# Patient Record
Sex: Male | Born: 1965 | Race: White | Hispanic: No | Marital: Married | Smoking: Former smoker
Health system: Southern US, Community
[De-identification: ages and names within clinical notes are randomized; demographics above are authoritative.]

## PROBLEM LIST (undated history)

## (undated) DIAGNOSIS — M4802 Spinal stenosis, cervical region: Secondary | ICD-10-CM

## (undated) DIAGNOSIS — F329 Major depressive disorder, single episode, unspecified: Secondary | ICD-10-CM

## (undated) DIAGNOSIS — F32A Depression, unspecified: Secondary | ICD-10-CM

## (undated) HISTORY — PX: OTHER SURGICAL HISTORY: SHX169

## (undated) HISTORY — PX: CERVICAL SPINE SURGERY: SHX589

---

## 2005-01-12 ENCOUNTER — Emergency Department (HOSPITAL_COMMUNITY): Admission: EM | Admit: 2005-01-12 | Discharge: 2005-01-12 | Payer: Self-pay | Admitting: Family Medicine

## 2011-01-25 ENCOUNTER — Encounter: Payer: Self-pay | Admitting: Emergency Medicine

## 2011-01-25 ENCOUNTER — Emergency Department (HOSPITAL_COMMUNITY)
Admission: EM | Admit: 2011-01-25 | Discharge: 2011-01-25 | Disposition: A | Discharge status: Home / Self Care | Payer: BC Managed Care – PPO | Attending: Emergency Medicine | Admitting: Emergency Medicine

## 2011-01-25 DIAGNOSIS — R55 Syncope and collapse: Secondary | ICD-10-CM

## 2011-01-25 DIAGNOSIS — F3289 Other specified depressive episodes: Secondary | ICD-10-CM | POA: Insufficient documentation

## 2011-01-25 DIAGNOSIS — R404 Transient alteration of awareness: Secondary | ICD-10-CM | POA: Insufficient documentation

## 2011-01-25 DIAGNOSIS — M4802 Spinal stenosis, cervical region: Secondary | ICD-10-CM | POA: Insufficient documentation

## 2011-01-25 DIAGNOSIS — F329 Major depressive disorder, single episode, unspecified: Secondary | ICD-10-CM | POA: Insufficient documentation

## 2011-01-25 DIAGNOSIS — R110 Nausea: Secondary | ICD-10-CM | POA: Insufficient documentation

## 2011-01-25 DIAGNOSIS — R42 Dizziness and giddiness: Secondary | ICD-10-CM | POA: Insufficient documentation

## 2011-01-25 HISTORY — DX: Major depressive disorder, single episode, unspecified: F32.9

## 2011-01-25 HISTORY — DX: Depression, unspecified: F32.A

## 2011-01-25 HISTORY — DX: Spinal stenosis, cervical region: M48.02

## 2011-01-25 LAB — POCT I-STAT, CHEM 8
Hemoglobin: 13.9 g/dL (ref 13.0–17.0)
Sodium: 141 mEq/L (ref 135–145)
TCO2: 28 mmol/L (ref 0–100)

## 2011-01-25 LAB — CARDIAC PANEL(CRET KIN+CKTOT+MB+TROPI)
CK, MB: 3.3 ng/mL (ref 0.3–4.0)
Relative Index: 1.7 (ref 0.0–2.5)
Total CK: 196 U/L (ref 7–232)

## 2011-01-25 LAB — CBC
Hemoglobin: 13.7 g/dL (ref 13.0–17.0)
RBC: 4.36 MIL/uL (ref 4.22–5.81)

## 2011-01-25 MED ORDER — SODIUM CHLORIDE 0.9 % IV BOLUS (SEPSIS)
1000.0000 mL | Freq: Once | INTRAVENOUS | Status: AC
  Administered 2011-01-25: 1000 mL via INTRAVENOUS

## 2011-01-25 NOTE — ED Provider Notes (Signed)
History     CSN: [id number]  Arrival date & time 01/25/11  4098   First MD Initiated Contact with Patient 01/25/11 [phone]      Chief Complaint  Patient presents with  . Loss of Consciousness    (Consider location/radiation/quality/duration/timing/severity/associated sxs/prior treatment) Patient is a 46 y.o. male presenting with syncope. The history is provided by the patient.  Loss of Consciousness The current episode started 1 to 2 hours ago. The problem has been resolved. Pertinent negatives include no chest pain, no abdominal pain, no headaches and no shortness of breath. The symptoms are aggravated by standing.  Pt states he has had multiple episodes of syncope in the past associated with pain, nausea, and position change. His SBP runs in the 90's. He "jumped up from bed this morning due to a calf cramp to stretch it out. While doing so, he became lightheaded and nauseated. He sat down and lost consciousness briefly. Wife witnessed event. No significant head or neck trauma. Is feeling better now though he states he feels tired.  Past Medical History  Diagnosis Date  . Depression   . Cervical stenosis of spine     Past Surgical History  Procedure Date  . Cervical spine surgery     No family history on file.  History  Substance Use Topics  . Smoking status: Former Smoker -- 1.0 packs/day for 21 years  . Smokeless tobacco: Not on file  . Alcohol Use: 3.0 oz/week    5 Glasses of wine per week      Review of Systems  Constitutional: Positive for fatigue. Negative for fever, chills and diaphoresis.  HENT: Negative for neck pain.   Eyes: Negative.   Respiratory: Negative.  Negative for shortness of breath.   Cardiovascular: Positive for syncope. Negative for chest pain, palpitations and leg swelling.  Gastrointestinal: Positive for nausea. Negative for vomiting, abdominal pain, diarrhea and constipation.  Skin: Negative for rash and wound.  Neurological: Positive for  syncope and light-headedness. Negative for weakness, numbness and headaches.    Allergies  Review of patient's allergies indicates no known allergies.  Home Medications   Current Outpatient Rx  Name Route Sig Dispense Refill  . CITALOPRAM HYDROBROMIDE 20 MG PO TABS Oral Take 20 mg by mouth daily.        BP 105/74  Pulse 66  Temp(Src) 97.6 F (36.4 C) (Oral)  Resp 14  SpO2 98%  Physical Exam  Nursing note and vitals reviewed. Constitutional: He is oriented to person, place, and time. He appears well-developed and well-nourished. No distress.  HENT:  Head: Normocephalic and atraumatic.  Mouth/Throat: Oropharynx is clear and moist.  Eyes: EOM are normal. Pupils are equal, round, and reactive to light.  Neck: Normal range of motion. Neck supple.       No cervical midline TTP  Cardiovascular: Normal rate and regular rhythm.  Exam reveals no gallop and no friction rub.   No murmur heard. Pulmonary/Chest: Effort normal and breath sounds normal. No respiratory distress. He has no wheezes. He has no rales.  Abdominal: Soft. Bowel sounds are normal. He exhibits no mass. There is no tenderness. There is no rebound and no guarding.  Musculoskeletal: Normal range of motion. He exhibits no edema and no tenderness.  Neurological: He is alert and oriented to person, place, and time.       Finger to nose exam intact, 5/5 strength in all ext, sensation intact  Skin: Skin is warm and dry. No rash noted. No  erythema.  Psychiatric: He has a normal mood and affect. His behavior is normal.    ED Course  Procedures (including critical care time)  Labs Reviewed  POCT I-STAT, CHEM 8 - Abnormal; Notable for the following:    Glucose, Bld 101 (*)    All other components within normal limits  CBC  CARDIAC PANEL(CRET KIN+CKTOT+MB+TROPI)  I-STAT, CHEM 8   No results found.   1. Syncope      Date: 01/25/2011  Rate: 61  Rhythm: normal sinus rhythm  QRS Axis: normal  Intervals: normal   ST/T Wave abnormalities: normal  Conduction Disutrbances:none  Narrative Interpretation:   Old EKG Reviewed: unchanged    MDM  Will monitor pt closely and rule out insidious causes of pt syncope  Pt feeling well eating lunch. Have instructed to keep hydrated, rise slowly, f/u with pmd and return for worsening symptoms or concerns. Pt voices understanding      Loren Racer, MD 01/25/11 [phone]

## 2011-01-25 NOTE — ED Notes (Signed)
MD at bedside. 

## 2011-01-25 NOTE — ED Notes (Signed)
Per EMS pt woke up with cramp in leg, stood up quickly and next thing he knew he was on the floor with his wife yelling to him. Pt's wife stated he was unresponsive for a couple of minutes.Alert and oriented when EMS arrived,  pupils dilated, VSS, incomplete LBBB, pale.

## 2011-01-26 LAB — GLUCOSE, CAPILLARY: Glucose-Capillary: 95 mg/dL (ref 70–99)

## 2011-10-18 ENCOUNTER — Other Ambulatory Visit: Payer: Self-pay | Admitting: Sports Medicine

## 2011-10-19 ENCOUNTER — Other Ambulatory Visit: Payer: Self-pay | Admitting: *Deleted

## 2011-10-19 MED ORDER — CITALOPRAM HYDROBROMIDE 20 MG PO TABS: 20.0000 mg | ORAL_TABLET | Freq: Every day | ORAL | Status: DC

## 2011-10-19 NOTE — Progress Notes (Signed)
Refilled per Dr. Margaretha Sheffield.  Called pt and asked him to schedule an appt to establish care here.

## 2012-01-15 ENCOUNTER — Other Ambulatory Visit: Payer: Self-pay | Admitting: Sports Medicine

## 2012-01-19 ENCOUNTER — Other Ambulatory Visit: Payer: Self-pay | Admitting: *Deleted

## 2012-01-19 ENCOUNTER — Other Ambulatory Visit: Payer: Self-pay | Admitting: Sports Medicine

## 2012-01-19 MED ORDER — CITALOPRAM HYDROBROMIDE 20 MG PO TABS: 20.0000 mg | ORAL_TABLET | Freq: Every day | ORAL | Status: DC

## 2012-01-26 ENCOUNTER — Ambulatory Visit: Payer: BC Managed Care – PPO | Admitting: Sports Medicine

## 2018-03-05 ENCOUNTER — Other Ambulatory Visit: Payer: Self-pay | Admitting: Family Medicine

## 2018-03-05 DIAGNOSIS — M5126 Other intervertebral disc displacement, lumbar region: Secondary | ICD-10-CM

## 2018-03-05 DIAGNOSIS — M5442 Lumbago with sciatica, left side: Secondary | ICD-10-CM

## 2018-03-07 ENCOUNTER — Other Ambulatory Visit: Payer: Self-pay | Admitting: Family Medicine

## 2018-03-15 ENCOUNTER — Ambulatory Visit
Admission: RE | Admit: 2018-03-15 | Discharge: 2018-03-15 | Disposition: A | Discharge status: Home / Self Care | Payer: BC Managed Care – PPO | Source: Ambulatory Visit | Attending: Family Medicine | Admitting: Family Medicine

## 2018-03-15 DIAGNOSIS — M5442 Lumbago with sciatica, left side: Secondary | ICD-10-CM

## 2018-03-15 DIAGNOSIS — M5126 Other intervertebral disc displacement, lumbar region: Secondary | ICD-10-CM

## 2019-06-04 ENCOUNTER — Other Ambulatory Visit: Payer: Self-pay | Admitting: Sports Medicine

## 2019-06-05 ENCOUNTER — Ambulatory Visit
Admission: RE | Admit: 2019-06-05 | Discharge: 2019-06-05 | Disposition: A | Discharge status: Home / Self Care | Payer: BC Managed Care – PPO | Source: Ambulatory Visit | Attending: Sports Medicine | Admitting: Sports Medicine

## 2019-06-05 ENCOUNTER — Other Ambulatory Visit: Payer: Self-pay | Admitting: Sports Medicine

## 2019-06-05 DIAGNOSIS — M25531 Pain in right wrist: Secondary | ICD-10-CM

## 2021-03-27 ENCOUNTER — Emergency Department (HOSPITAL_BASED_OUTPATIENT_CLINIC_OR_DEPARTMENT_OTHER): Payer: BC Managed Care – PPO

## 2021-03-27 ENCOUNTER — Emergency Department (HOSPITAL_BASED_OUTPATIENT_CLINIC_OR_DEPARTMENT_OTHER)
Admission: EM | Admit: 2021-03-27 | Discharge: 2021-03-27 | Disposition: A | Discharge status: Home / Self Care | Payer: BC Managed Care – PPO | Attending: Emergency Medicine | Admitting: Emergency Medicine

## 2021-03-27 ENCOUNTER — Encounter (HOSPITAL_BASED_OUTPATIENT_CLINIC_OR_DEPARTMENT_OTHER): Payer: Self-pay | Admitting: Emergency Medicine

## 2021-03-27 ENCOUNTER — Other Ambulatory Visit: Payer: Self-pay

## 2021-03-27 DIAGNOSIS — S52044A Nondisplaced fracture of coronoid process of right ulna, initial encounter for closed fracture: Secondary | ICD-10-CM | POA: Insufficient documentation

## 2021-03-27 DIAGNOSIS — S52121A Displaced fracture of head of right radius, initial encounter for closed fracture: Secondary | ICD-10-CM | POA: Insufficient documentation

## 2021-03-27 NOTE — ED Notes (Signed)
ED Provider at bedside. 

## 2021-03-27 NOTE — ED Notes (Signed)
Patient transported to CT 

## 2021-03-27 NOTE — ED Notes (Signed)
This CNA will apply long arm splint after CT scan is performed ?

## 2021-03-27 NOTE — ED Notes (Signed)
EMT-P provided AVS using Teachback Method. Patient verbalizes understanding of Discharge Instructions. Opportunity for Questioning and Answers were provided by EMT-P. Patient Discharged from ED after splint application.  ? ?
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
## 2021-03-27 NOTE — Discharge Instructions (Signed)
Keep the splint in place till you see Dr. Steward Drone.  Elevate as tolerated and use the sling for comfort.  No lifting with the right arm.  Tylenol and ibuprofen as needed for pain. ?

## 2021-03-31 ENCOUNTER — Ambulatory Visit (HOSPITAL_BASED_OUTPATIENT_CLINIC_OR_DEPARTMENT_OTHER)
Admission: RE | Admit: 2021-03-31 | Discharge: 2021-03-31 | Disposition: A | Discharge status: Home / Self Care | Payer: BC Managed Care – PPO | Source: Ambulatory Visit | Attending: Orthopaedic Surgery | Admitting: Orthopaedic Surgery

## 2021-03-31 ENCOUNTER — Other Ambulatory Visit (HOSPITAL_BASED_OUTPATIENT_CLINIC_OR_DEPARTMENT_OTHER): Payer: Self-pay | Admitting: Orthopaedic Surgery

## 2021-03-31 ENCOUNTER — Ambulatory Visit (HOSPITAL_BASED_OUTPATIENT_CLINIC_OR_DEPARTMENT_OTHER): Payer: BC Managed Care – PPO | Admitting: Orthopaedic Surgery

## 2021-03-31 ENCOUNTER — Other Ambulatory Visit: Payer: Self-pay

## 2021-03-31 DIAGNOSIS — M25521 Pain in right elbow: Secondary | ICD-10-CM | POA: Insufficient documentation

## 2021-03-31 DIAGNOSIS — S52121A Displaced fracture of head of right radius, initial encounter for closed fracture: Secondary | ICD-10-CM
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
## 2021-04-06 ENCOUNTER — Other Ambulatory Visit (HOSPITAL_BASED_OUTPATIENT_CLINIC_OR_DEPARTMENT_OTHER): Payer: Self-pay | Admitting: Orthopaedic Surgery

## 2021-04-06 DIAGNOSIS — S52121A Displaced fracture of head of right radius, initial encounter for closed fracture: Secondary | ICD-10-CM

## 2021-04-07 ENCOUNTER — Ambulatory Visit (HOSPITAL_BASED_OUTPATIENT_CLINIC_OR_DEPARTMENT_OTHER): Payer: BC Managed Care – PPO | Admitting: Orthopaedic Surgery

## 2021-04-07 ENCOUNTER — Ambulatory Visit (HOSPITAL_BASED_OUTPATIENT_CLINIC_OR_DEPARTMENT_OTHER)
Admission: RE | Admit: 2021-04-07 | Discharge: 2021-04-07 | Disposition: A | Discharge status: Home / Self Care | Payer: BC Managed Care – PPO | Source: Ambulatory Visit | Attending: Orthopaedic Surgery | Admitting: Orthopaedic Surgery

## 2021-04-07 ENCOUNTER — Other Ambulatory Visit (HOSPITAL_BASED_OUTPATIENT_CLINIC_OR_DEPARTMENT_OTHER): Payer: Self-pay | Admitting: Orthopaedic Surgery

## 2021-04-07 ENCOUNTER — Other Ambulatory Visit: Payer: Self-pay

## 2021-04-07 DIAGNOSIS — S52121A Displaced fracture of head of right radius, initial encounter for closed fracture: Secondary | ICD-10-CM | POA: Insufficient documentation
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
## 2021-04-21 ENCOUNTER — Ambulatory Visit (HOSPITAL_BASED_OUTPATIENT_CLINIC_OR_DEPARTMENT_OTHER): Payer: BC Managed Care – PPO | Admitting: Orthopaedic Surgery

## 2021-04-21 DIAGNOSIS — S52121A Displaced fracture of head of right radius, initial encounter for closed fracture: Secondary | ICD-10-CM | POA: Diagnosis not present
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
## 2021-05-03 ENCOUNTER — Other Ambulatory Visit (HOSPITAL_BASED_OUTPATIENT_CLINIC_OR_DEPARTMENT_OTHER): Payer: Self-pay | Admitting: Orthopaedic Surgery

## 2021-05-03 ENCOUNTER — Ambulatory Visit (HOSPITAL_BASED_OUTPATIENT_CLINIC_OR_DEPARTMENT_OTHER)
Admission: RE | Admit: 2021-05-03 | Discharge: 2021-05-03 | Disposition: A | Discharge status: Home / Self Care | Payer: BC Managed Care – PPO | Source: Ambulatory Visit | Attending: Orthopaedic Surgery | Admitting: Orthopaedic Surgery

## 2021-05-03 ENCOUNTER — Ambulatory Visit (HOSPITAL_BASED_OUTPATIENT_CLINIC_OR_DEPARTMENT_OTHER): Payer: BC Managed Care – PPO | Admitting: Orthopaedic Surgery

## 2021-05-03 DIAGNOSIS — S52121A Displaced fracture of head of right radius, initial encounter for closed fracture: Secondary | ICD-10-CM | POA: Insufficient documentation
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
## 2021-05-05 ENCOUNTER — Ambulatory Visit (HOSPITAL_BASED_OUTPATIENT_CLINIC_OR_DEPARTMENT_OTHER): Payer: BC Managed Care – PPO | Admitting: Orthopaedic Surgery

## 2021-06-07 ENCOUNTER — Ambulatory Visit (HOSPITAL_BASED_OUTPATIENT_CLINIC_OR_DEPARTMENT_OTHER): Payer: BC Managed Care – PPO | Admitting: Orthopaedic Surgery

## 2021-06-09 ENCOUNTER — Other Ambulatory Visit (HOSPITAL_BASED_OUTPATIENT_CLINIC_OR_DEPARTMENT_OTHER): Payer: Self-pay | Admitting: Orthopaedic Surgery

## 2021-06-09 ENCOUNTER — Ambulatory Visit (HOSPITAL_BASED_OUTPATIENT_CLINIC_OR_DEPARTMENT_OTHER): Payer: BC Managed Care – PPO | Admitting: Orthopaedic Surgery

## 2021-06-09 ENCOUNTER — Ambulatory Visit (INDEPENDENT_AMBULATORY_CARE_PROVIDER_SITE_OTHER): Payer: BC Managed Care – PPO

## 2021-06-09 DIAGNOSIS — S52121A Displaced fracture of head of right radius, initial encounter for closed fracture: Secondary | ICD-10-CM | POA: Diagnosis not present
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
## 2021-06-18 ENCOUNTER — Telehealth: Payer: Self-pay | Admitting: Orthopaedic Surgery

## 2021-06-18 NOTE — Telephone Encounter (Signed)
RC to patient and informed him fax was sent 05/31, but I can leave the hard copy at our front desk for him to pick up

## 2021-06-18 NOTE — Telephone Encounter (Signed)
Pt called requesting a call back from [place]. Pt states his physical therapist sent a requesting for orders of a brace to help with range of motion. Please call pt about this matter at [phone]. Please have Dr. Steward Drone sign order and contact pt

## 2021-07-21 ENCOUNTER — Ambulatory Visit (HOSPITAL_BASED_OUTPATIENT_CLINIC_OR_DEPARTMENT_OTHER): Payer: BC Managed Care – PPO | Admitting: Orthopaedic Surgery

## 2021-07-21 DIAGNOSIS — S52121A Displaced fracture of head of right radius, initial encounter for closed fracture: Secondary | ICD-10-CM | POA: Diagnosis not present
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
## 2021-07-26 ENCOUNTER — Telehealth (HOSPITAL_BASED_OUTPATIENT_CLINIC_OR_DEPARTMENT_OTHER): Payer: Self-pay | Admitting: Orthopaedic Surgery

## 2021-07-26 NOTE — Telephone Encounter (Signed)
Pre-op

## 2021-07-28 ENCOUNTER — Ambulatory Visit (HOSPITAL_BASED_OUTPATIENT_CLINIC_OR_DEPARTMENT_OTHER): Payer: BC Managed Care – PPO | Admitting: Orthopaedic Surgery

## 2021-07-30 ENCOUNTER — Telehealth: Payer: Self-pay | Admitting: Orthopaedic Surgery

## 2021-07-30 NOTE — Telephone Encounter (Signed)
Patient called with a few questions about surgery, and after. Most questions were answered, but patient did have a question about limitations after surgery. Patient is planning a get away one week post surgery, and would like to know what to expect as far a lifting limitations with his arm. Please call to advise.

## 2021-07-30 NOTE — Telephone Encounter (Signed)
Holding for Dr Leanora Cover

## 2021-08-02 ENCOUNTER — Telehealth (HOSPITAL_BASED_OUTPATIENT_CLINIC_OR_DEPARTMENT_OTHER): Payer: Self-pay | Admitting: Orthopaedic Surgery

## 2021-08-02 NOTE — Telephone Encounter (Signed)
Request to resched surgery

## 2021-08-02 NOTE — Telephone Encounter (Signed)
I spoke with patient, and rescheduled his surgery until 08/31/21 for MCD.

## 2021-08-10 ENCOUNTER — Ambulatory Visit (INDEPENDENT_AMBULATORY_CARE_PROVIDER_SITE_OTHER): Payer: BC Managed Care – PPO | Admitting: Adult Health

## 2021-08-10 ENCOUNTER — Encounter: Payer: Self-pay | Admitting: Adult Health

## 2021-08-10 DIAGNOSIS — R0683 Snoring: Secondary | ICD-10-CM | POA: Diagnosis not present

## 2021-08-10 NOTE — Patient Instructions (Signed)
Set up for home sleep study  Work on healthy weight  Do not drive if sleepy  Follow up in 6 weeks to discuss results and treatment plan

## 2021-08-10 NOTE — Addendum Note (Signed)
Addended by: Delrae Rend on: 08/10/2021 09:52 AM   Modules accepted: Orders

## 2021-08-10 NOTE — Assessment & Plan Note (Signed)
Snoring , daytime sleepiness, and restless sleep all suspicious for underlying Sleep apnea.  - discussed how weight can impact sleep and risk for sleep disordered breathing - discussed options to assist with weight loss: combination of diet modification, cardiovascular and strength training exercises   - had an extensive discussion regarding the adverse health consequences related to untreated sleep disordered breathing - specifically discussed the risks for hypertension, coronary artery disease, cardiac dysrhythmias, cerebrovascular disease, and diabetes - lifestyle modification discussed   - discussed how sleep disruption can increase risk of accidents, particularly when driving - safe driving practices were discussed   Set up for home sleep study   Plan  Patient Instructions  Set up for home sleep study  Work on healthy weight  Do not drive if sleepy  Follow up in 6 weeks to discuss results and treatment plan
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
## 2021-08-19 NOTE — Progress Notes (Signed)
Reviewed and agree with assessment/plan.   Duaine Radin, MD [facility] 08/19/2021, 9:57 AM Pager:  [phone]  

## 2021-08-23 ENCOUNTER — Encounter (HOSPITAL_BASED_OUTPATIENT_CLINIC_OR_DEPARTMENT_OTHER): Payer: BC Managed Care – PPO | Admitting: Orthopaedic Surgery

## 2021-08-24 ENCOUNTER — Other Ambulatory Visit: Payer: Self-pay

## 2021-08-24 ENCOUNTER — Encounter (HOSPITAL_BASED_OUTPATIENT_CLINIC_OR_DEPARTMENT_OTHER): Payer: Self-pay | Admitting: Orthopaedic Surgery

## 2021-08-25 ENCOUNTER — Ambulatory Visit (HOSPITAL_BASED_OUTPATIENT_CLINIC_OR_DEPARTMENT_OTHER): Payer: Self-pay | Admitting: Orthopaedic Surgery

## 2021-08-25 DIAGNOSIS — S52121A Displaced fracture of head of right radius, initial encounter for closed fracture: Secondary | ICD-10-CM

## 2021-08-31 ENCOUNTER — Ambulatory Visit (HOSPITAL_BASED_OUTPATIENT_CLINIC_OR_DEPARTMENT_OTHER): Payer: BC Managed Care – PPO | Admitting: Anesthesiology

## 2021-08-31 ENCOUNTER — Encounter (HOSPITAL_BASED_OUTPATIENT_CLINIC_OR_DEPARTMENT_OTHER)
Admission: RE | Disposition: A | Discharge status: Home / Self Care | Payer: Self-pay | Source: Home / Self Care | Attending: Orthopaedic Surgery

## 2021-08-31 ENCOUNTER — Ambulatory Visit (HOSPITAL_BASED_OUTPATIENT_CLINIC_OR_DEPARTMENT_OTHER)
Admission: RE | Admit: 2021-08-31 | Discharge: 2021-08-31 | Disposition: A | Discharge status: Home / Self Care | Payer: BC Managed Care – PPO | Attending: Orthopaedic Surgery | Admitting: Orthopaedic Surgery

## 2021-08-31 ENCOUNTER — Ambulatory Visit (HOSPITAL_BASED_OUTPATIENT_CLINIC_OR_DEPARTMENT_OTHER): Payer: BC Managed Care – PPO

## 2021-08-31 ENCOUNTER — Other Ambulatory Visit: Payer: Self-pay

## 2021-08-31 ENCOUNTER — Encounter (HOSPITAL_BASED_OUTPATIENT_CLINIC_OR_DEPARTMENT_OTHER): Payer: Self-pay | Admitting: Orthopaedic Surgery

## 2021-08-31 DIAGNOSIS — M24021 Loose body in right elbow: Secondary | ICD-10-CM | POA: Insufficient documentation

## 2021-08-31 DIAGNOSIS — M4802 Spinal stenosis, cervical region: Secondary | ICD-10-CM

## 2021-08-31 DIAGNOSIS — S52121A Displaced fracture of head of right radius, initial encounter for closed fracture: Secondary | ICD-10-CM

## 2021-08-31 DIAGNOSIS — M24521 Contracture, right elbow: Secondary | ICD-10-CM

## 2021-08-31 DIAGNOSIS — M24621 Ankylosis, right elbow: Secondary | ICD-10-CM | POA: Insufficient documentation

## 2021-08-31 DIAGNOSIS — Z87891 Personal history of nicotine dependence: Secondary | ICD-10-CM | POA: Insufficient documentation

## 2021-08-31 HISTORY — PX: ELBOW ARTHROSCOPY: SHX614

## 2021-08-31 SURGERY — ARTHROSCOPY, ELBOW
Anesthesia: General | Site: Elbow | Laterality: Right

## 2021-08-31 MED ORDER — FENTANYL CITRATE (PF) 100 MCG/2ML IJ SOLN
100.0000 ug | Freq: Once | INTRAMUSCULAR | Status: AC
  Administered 2021-08-31: 50 ug via INTRAVENOUS

## 2021-08-31 MED ORDER — SODIUM CHLORIDE 0.9 % IR SOLN
Status: DC | PRN
  Administered 2021-08-31: 3500 mL

## 2021-08-31 MED ORDER — FENTANYL CITRATE (PF) 100 MCG/2ML IJ SOLN: 25.0000 ug | INTRAMUSCULAR | Status: DC | PRN

## 2021-08-31 MED ORDER — FENTANYL CITRATE (PF) 100 MCG/2ML IJ SOLN
INTRAMUSCULAR | Status: AC
  Filled 2021-08-31: qty 2

## 2021-08-31 MED ORDER — DIPHENHYDRAMINE HCL 50 MG/ML IJ SOLN
INTRAMUSCULAR | Status: DC | PRN
  Administered 2021-08-31: 12.5 mg via INTRAVENOUS

## 2021-08-31 MED ORDER — ACETAMINOPHEN 500 MG PO TABS
ORAL_TABLET | ORAL | Status: AC
  Filled 2021-08-31: qty 2

## 2021-08-31 MED ORDER — ONDANSETRON HCL 4 MG/2ML IJ SOLN
INTRAMUSCULAR | Status: DC | PRN
  Administered 2021-08-31: 4 mg via INTRAVENOUS

## 2021-08-31 MED ORDER — MIDAZOLAM HCL 2 MG/2ML IJ SOLN
2.0000 mg | Freq: Once | INTRAMUSCULAR | Status: AC
  Administered 2021-08-31: 1 mg via INTRAVENOUS

## 2021-08-31 MED ORDER — CEFAZOLIN SODIUM-DEXTROSE 2-4 GM/100ML-% IV SOLN
INTRAVENOUS | Status: AC
  Filled 2021-08-31: qty 100

## 2021-08-31 MED ORDER — OXYCODONE HCL 5 MG PO TABS: 5.0000 mg | ORAL_TABLET | ORAL | 0 refills | Status: DC | PRN

## 2021-08-31 MED ORDER — MIDAZOLAM HCL 5 MG/5ML IJ SOLN
INTRAMUSCULAR | Status: DC | PRN
  Administered 2021-08-31: 1 mg via INTRAVENOUS

## 2021-08-31 MED ORDER — LACTATED RINGERS IV SOLN: INTRAVENOUS | Status: DC

## 2021-08-31 MED ORDER — MIDAZOLAM HCL 2 MG/2ML IJ SOLN
INTRAMUSCULAR | Status: AC
  Filled 2021-08-31: qty 2

## 2021-08-31 MED ORDER — LIDOCAINE 2% (20 MG/ML) 5 ML SYRINGE
INTRAMUSCULAR | Status: AC
  Filled 2021-08-31: qty 5

## 2021-08-31 MED ORDER — TRANEXAMIC ACID-NACL 1000-0.7 MG/100ML-% IV SOLN
INTRAVENOUS | Status: AC
  Filled 2021-08-31: qty 100

## 2021-08-31 MED ORDER — OXYCODONE HCL 5 MG/5ML PO SOLN: 5.0000 mg | Freq: Once | ORAL | Status: DC | PRN

## 2021-08-31 MED ORDER — ONDANSETRON HCL 4 MG/2ML IJ SOLN: 4.0000 mg | Freq: Once | INTRAMUSCULAR | Status: DC | PRN

## 2021-08-31 MED ORDER — LIDOCAINE HCL 1 % IJ SOLN
INTRAMUSCULAR | Status: DC | PRN
  Administered 2021-08-31: 50 mg via INTRADERMAL

## 2021-08-31 MED ORDER — ONDANSETRON HCL 4 MG/2ML IJ SOLN
INTRAMUSCULAR | Status: AC
  Filled 2021-08-31: qty 2

## 2021-08-31 MED ORDER — PROPOFOL 500 MG/50ML IV EMUL
INTRAVENOUS | Status: AC
  Filled 2021-08-31: qty 50

## 2021-08-31 MED ORDER — ACETAMINOPHEN 500 MG PO TABS: 500.0000 mg | ORAL_TABLET | Freq: Three times a day (TID) | ORAL | 0 refills | Status: AC

## 2021-08-31 MED ORDER — DEXAMETHASONE SODIUM PHOSPHATE 10 MG/ML IJ SOLN
INTRAMUSCULAR | Status: AC
  Filled 2021-08-31: qty 1

## 2021-08-31 MED ORDER — GABAPENTIN 300 MG PO CAPS
ORAL_CAPSULE | ORAL | Status: AC
  Filled 2021-08-31: qty 1

## 2021-08-31 MED ORDER — TRANEXAMIC ACID-NACL 1000-0.7 MG/100ML-% IV SOLN
1000.0000 mg | INTRAVENOUS | Status: AC
  Administered 2021-08-31: 1000 mg via INTRAVENOUS

## 2021-08-31 MED ORDER — FENTANYL CITRATE (PF) 100 MCG/2ML IJ SOLN
INTRAMUSCULAR | Status: DC | PRN
  Administered 2021-08-31: 50 ug via INTRAVENOUS

## 2021-08-31 MED ORDER — PROPOFOL 500 MG/50ML IV EMUL
INTRAVENOUS | Status: DC | PRN
  Administered 2021-08-31: 110 ug/kg/min via INTRAVENOUS

## 2021-08-31 MED ORDER — IBUPROFEN 800 MG PO TABS: 800.0000 mg | ORAL_TABLET | Freq: Three times a day (TID) | ORAL | 0 refills | Status: AC

## 2021-08-31 MED ORDER — GABAPENTIN 300 MG PO CAPS
300.0000 mg | ORAL_CAPSULE | Freq: Once | ORAL | Status: AC
  Administered 2021-08-31: 300 mg via ORAL

## 2021-08-31 MED ORDER — DEXAMETHASONE SODIUM PHOSPHATE 10 MG/ML IJ SOLN
INTRAMUSCULAR | Status: DC | PRN
  Administered 2021-08-31: 8 mg via INTRAVENOUS

## 2021-08-31 MED ORDER — OXYCODONE HCL 5 MG PO TABS: 5.0000 mg | ORAL_TABLET | Freq: Once | ORAL | Status: DC | PRN

## 2021-08-31 MED ORDER — ROPIVACAINE HCL 5 MG/ML IJ SOLN
INTRAMUSCULAR | Status: DC | PRN
  Administered 2021-08-31: 30 mL via PERINEURAL

## 2021-08-31 MED ORDER — ACETAMINOPHEN 500 MG PO TABS
1000.0000 mg | ORAL_TABLET | Freq: Once | ORAL | Status: AC
  Administered 2021-08-31: 1000 mg via ORAL

## 2021-08-31 MED ORDER — CEFAZOLIN SODIUM-DEXTROSE 2-4 GM/100ML-% IV SOLN
2.0000 g | INTRAVENOUS | Status: AC
  Administered 2021-08-31: 2 g via INTRAVENOUS

## 2021-08-31 MED ORDER — PROPOFOL 10 MG/ML IV BOLUS
INTRAVENOUS | Status: DC | PRN
  Administered 2021-08-31: 170 mg via INTRAVENOUS

## 2021-08-31 SURGICAL SUPPLY — 51 items
APL PRP STRL LF DISP 70% ISPRP (MISCELLANEOUS) ×1
BLADE EXCALIBUR 4.0X13 (MISCELLANEOUS) IMPLANT
BLADE SURG 15 STRL LF DISP TIS (BLADE) IMPLANT
BLADE SURG 15 STRL SS (BLADE)
BNDG CMPR 9X4 STRL LF SNTH (GAUZE/BANDAGES/DRESSINGS)
BNDG ELASTIC 4X5.8 VLCR STR LF (GAUZE/BANDAGES/DRESSINGS) ×2 IMPLANT
BNDG ELASTIC 6X5.8 VLCR STR LF (GAUZE/BANDAGES/DRESSINGS) ×2 IMPLANT
BNDG ESMARK 4X9 LF (GAUZE/BANDAGES/DRESSINGS) IMPLANT
CHLORAPREP W/TINT 26 (MISCELLANEOUS) ×2 IMPLANT
CUFF TOURN SGL QUICK 18X4 (TOURNIQUET CUFF) IMPLANT
DISSECTOR  3.8MM X 13CM (MISCELLANEOUS) ×2
DISSECTOR 3.8MM X 13CM (MISCELLANEOUS) ×1 IMPLANT
DRAPE ARTHROSCOPY W/POUCH 90 (DRAPES) ×2 IMPLANT
DRAPE C-ARM 42X72 X-RAY (DRAPES) ×1 IMPLANT
DRAPE IMP U-DRAPE 54X76 (DRAPES) ×2 IMPLANT
DRAPE INCISE IOBAN 66X45 STRL (DRAPES) IMPLANT
DRAPE U-SHAPE 47X51 STRL (DRAPES) ×2 IMPLANT
DRSG PAD ABDOMINAL 8X10 ST (GAUZE/BANDAGES/DRESSINGS) ×2 IMPLANT
DW OUTFLOW CASSETTE/TUBE SET (MISCELLANEOUS) IMPLANT
ELECT REM PT RETURN 9FT ADLT (ELECTROSURGICAL)
ELECTRODE REM PT RTRN 9FT ADLT (ELECTROSURGICAL) IMPLANT
EXCALIBUR 3.8MM X 13CM (MISCELLANEOUS) ×1 IMPLANT
GAUZE 4X4 16PLY ~~LOC~~+RFID DBL (SPONGE) IMPLANT
GAUZE SPONGE 4X4 12PLY STRL (GAUZE/BANDAGES/DRESSINGS) ×2 IMPLANT
GAUZE XEROFORM 1X8 LF (GAUZE/BANDAGES/DRESSINGS) IMPLANT
GLOVE BIO SURGEON STRL SZ 6 (GLOVE) ×2 IMPLANT
GLOVE BIO SURGEON STRL SZ7.5 (GLOVE) ×2 IMPLANT
GLOVE BIOGEL PI IND STRL 6.5 (GLOVE) ×1 IMPLANT
GLOVE BIOGEL PI IND STRL 8 (GLOVE) ×1 IMPLANT
GLOVE BIOGEL PI INDICATOR 6.5 (GLOVE) ×1
GLOVE BIOGEL PI INDICATOR 8 (GLOVE) ×1
GOWN STRL REUS W/ TWL LRG LVL3 (GOWN DISPOSABLE) ×1 IMPLANT
GOWN STRL REUS W/ TWL XL LVL3 (GOWN DISPOSABLE) ×1 IMPLANT
GOWN STRL REUS W/TWL LRG LVL3 (GOWN DISPOSABLE) ×2
GOWN STRL REUS W/TWL XL LVL3 (GOWN DISPOSABLE) ×2
MANIFOLD NEPTUNE II (INSTRUMENTS) ×2 IMPLANT
NDL HYPO 18GX1.5 BLUNT FILL (NEEDLE) ×1 IMPLANT
NEEDLE HYPO 18GX1.5 BLUNT FILL (NEEDLE) ×2 IMPLANT
PACK ARTHROSCOPY DSU (CUSTOM PROCEDURE TRAY) ×2 IMPLANT
PACK BASIN DAY SURGERY FS (CUSTOM PROCEDURE TRAY) ×2 IMPLANT
PADDING CAST COTTON 6X4 STRL (CAST SUPPLIES) ×2 IMPLANT
PENCIL SMOKE EVACUATOR (MISCELLANEOUS) IMPLANT
PORT APPOLLO RF 90DEGREE MULTI (SURGICAL WAND) IMPLANT
SHAVER DISSECTOR 3.0 (BURR) ×1 IMPLANT
SLEEVE SCD COMPRESS KNEE MED (STOCKING) ×2 IMPLANT
SLING ARM FOAM STRAP LRG (SOFTGOODS) ×1 IMPLANT
SUT ETHILON 3 0 PS 1 (SUTURE) ×2 IMPLANT
SUT VIC AB 3-0 FS2 27 (SUTURE) IMPLANT
SYR 5ML LL (SYRINGE) ×2 IMPLANT
TOWEL GREEN STERILE FF (TOWEL DISPOSABLE) ×2 IMPLANT
TUBING ARTHROSCOPY IRRIG 16FT (MISCELLANEOUS) ×2 IMPLANT

## 2021-08-31 NOTE — Transfer of Care (Signed)
Immediate Anesthesia Transfer of Care Note  Patient: Mordecai Tindol  Procedure(s) Performed: RIGHT ARTHROSCOPY ELBOW WITH REMOVAL OF LOOSE BODY (Right: Elbow)  Patient Location: PACU  Anesthesia Type:General  Level of Consciousness: awake, alert , oriented and patient cooperative  Airway & Oxygen Therapy: Patient Spontanous Breathing and Patient connected to face mask oxygen  Post-op Assessment: Report given to RN and Post -op Vital signs reviewed and stable  Post vital signs: Reviewed and stable  Last Vitals:  Vitals Value Taken Time  BP 125/85 08/31/21 1239  Temp 36.5 C 08/31/21 1237  Pulse 64 08/31/21 1243  Resp 13 08/31/21 1243  SpO2 98 % 08/31/21 1243  Vitals shown include unvalidated device data.  Last Pain:  Vitals:   08/31/21 1239  TempSrc:   PainSc: 0-No pain      Patients Stated Pain Goal: 5 (08/31/21 0820)  Complications: No notable events documented.

## 2021-08-31 NOTE — Anesthesia Preprocedure Evaluation (Addendum)
Anesthesia Evaluation  Patient identified by MRN, date of birth, ID band Patient awake    Reviewed: Allergy & Precautions, NPO status , Patient's Chart, lab work & pertinent test results  Airway Mallampati: II  TM Distance: >3 FB Neck ROM: Full    Dental no notable dental hx. (+) Teeth Intact, Dental Advisory Given   Pulmonary former smoker,    Pulmonary exam normal breath sounds clear to auscultation       Cardiovascular negative cardio ROS Normal cardiovascular exam Rhythm:Regular Rate:Normal     Neuro/Psych PSYCHIATRIC DISORDERS Depression negative neurological ROS     GI/Hepatic negative GI ROS, Neg liver ROS,   Endo/Other  negative endocrine ROS  Renal/GU negative Renal ROS  negative genitourinary   Musculoskeletal Loose Body right elbow   Abdominal   Peds  Hematology negative hematology ROS (+)   Anesthesia Other Findings   Reproductive/Obstetrics negative OB ROS                             Anesthesia Physical Anesthesia Plan  ASA: 2  Anesthesia Plan: General   Post-op Pain Management: Regional block* and Minimal or no pain anticipated   Induction: Intravenous  PONV Risk Score and Plan: 1 and Treatment may vary due to age or medical condition and Propofol infusion  Airway Management Planned: LMA  Additional Equipment: None  Intra-op Plan:   Post-operative Plan: Extubation in OR  Informed Consent: I have reviewed the patients History and Physical, chart, labs and discussed the procedure including the risks, benefits and alternatives for the proposed anesthesia with the patient or authorized representative who has indicated his/her understanding and acceptance.     Dental advisory given  Plan Discussed with: CRNA and Anesthesiologist  Anesthesia Plan Comments:        Anesthesia Quick Evaluation
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
## 2021-08-31 NOTE — Brief Op Note (Signed)
   Brief Op Note  Date of Surgery: 08/31/2021  Preoperative Diagnosis: RIGHT ELBOW LOOSE BODY  Postoperative Diagnosis: same  Procedure: Procedure(s): RIGHT ARTHROSCOPY ELBOW WITH REMOVAL OF LOOSE BODY  Implants: * No implants in log *  Surgeons: Surgeon(s): [name], MD  Anesthesia: General    Estimated Blood Loss: See anesthesia record  Complications: None  Condition to PACU: Stable  Benancio Deeds, MD 08/31/2021 12:30 PM

## 2021-08-31 NOTE — Discharge Instructions (Addendum)
Discharge Instructions    Attending Surgeon: [name], MD Office Phone Number: [phone]   Diagnosis and Procedures:    Surgeries Performed: Right elbow debridement   Discharge Plan:    Diet: Resume usual diet. Begin with light or bland foods.  Drink plenty of fluids.  Activity:  You may use your arm as tolerated once her block is worn You are advised to go home directly from the hospital or surgical center. Restrict your activities.  GENERAL INSTRUCTIONS: 1.  Keep your surgical site elevated above your heart for at least 5-7 days or longer to prevent swelling. This will improve your comfort and your overall recovery following surgery.     2. Please call Dr. Serena Croissant office at [phone] with questions Monday-Friday during business hours. If no one answers, please leave a message and someone should get back to the patient within 24 hours. For emergencies please call 911 or proceed to the emergency room.   3. Patient to notify surgical team if experiences any of the following: Bowel/Bladder dysfunction, uncontrolled pain, nerve/muscle weakness, incision with increased drainage or redness, nausea/vomiting and Fever greater than 101.0 F.  Be alert for signs of infection including redness, streaking, odor, fever or chills. Be alert for excessive pain or bleeding and notify your surgeon immediately.  WOUND INSTRUCTIONS:   Leave your dressing/cast/splint in place until your post operative visit.  Keep it clean and dry.  Always keep the incision clean and dry until the staples/sutures are removed. If there is no drainage from the incision you should keep it open to air. If there is drainage from the incision you must keep it covered at all times until the drainage stops  Do not soak in a bath tub, hot tub, pool, lake or other body of water until 21 days after your surgery and your incision is completely dry and healed.  If you have removable sutures (or staples) they  must be removed 10-14 days (unless otherwise instructed) from the day of your surgery.     1)  Elevate the extremity as much as possible.  2)  Keep the dressing clean and dry.  3)  Please call us if the dressing becomes wet or dirty.  4)  If you are experiencing worsening pain or worsening swelling, please call.     MEDICATIONS: Resume all previous home medications at the previous prescribed dose and frequency unless otherwise noted Start taking the  pain medications on an as-needed basis as prescribed  Please taper down pain medication over the next week following surgery.  Ideally you should not require a refill of any narcotic pain medication.  Take pain medication with food to minimize nausea. In addition to the prescribed pain medication, you may take over-the-counter pain relievers such as Tylenol.  Do NOT take additional tylenol if your pain medication already has tylenol in it.  Aspirin 325mg  daily for four weeks.      FOLLOWUP INSTRUCTIONS: 1. Follow up at the Physical Therapy Clinic 3-4 days following surgery. This appointment should be scheduled unless other arrangements have been made.The Physical Therapy scheduling number is [phone] if an appointment has not already been arranged.  2. Contact Dr. [phone] office during office hours at [phone] or the practice after hours line at [phone] for non-emergencies. For medical emergencies call 911.   Discharge Location: Home   No Tylenol until after 2:30pm today   Post Anesthesia Home Care Instructions  Activity: Get plenty of rest for the  remainder of the day. A responsible individual must stay with you for 24 hours following the procedure.  For the next 24 hours, DO NOT: -Drive a car -Advertising copywriter -Drink alcoholic beverages -Take any medication unless instructed by your physician -Make any legal decisions or sign important papers.  Meals: Start with liquid foods such as gelatin or soup. Progress  to regular foods as tolerated. Avoid greasy, spicy, heavy foods. If nausea and/or vomiting occur, drink only clear liquids until the nausea and/or vomiting subsides. Call your physician if vomiting continues.  Special Instructions/Symptoms: Your throat may feel dry or sore from the anesthesia or the breathing tube placed in your throat during surgery. If this causes discomfort, gargle with warm salt water. The discomfort should disappear within 24 hours.  If you had a scopolamine patch placed behind your ear for the management of post- operative nausea and/or vomiting:  1. The medication in the patch is effective for 72 hours, after which it should be removed.  Wrap patch in a tissue and discard in the trash. Wash hands thoroughly with soap and water. 2. You may remove the patch earlier than 72 hours if you experience unpleasant side effects which may include dry mouth, dizziness or visual disturbances. 3. Avoid touching the patch. Wash your hands with soap and water after contact with the patch.  Regional Anesthesia Blocks  1. Numbness or the inability to move the "blocked" extremity may last from 3-48 hours after placement. The length of time depends on the medication injected and your individual response to the medication. If the numbness is not going away after 48 hours, call your surgeon.  2. The extremity that is blocked will need to be protected until the numbness is gone and the  Strength has returned. Because you cannot feel it, you will need to take extra care to avoid injury. Because it may be weak, you may have difficulty moving it or using it. You may not know what position it is in without looking at it while the block is in effect.  3. For blocks in the legs and feet, returning to weight bearing and walking needs to be done carefully. You will need to wait until the numbness is entirely gone and the strength has returned. You should be able to move your leg and foot normally before  you try and bear weight or walk. You will need someone to be with you when you first try to ensure you do not fall and possibly risk injury.  4. Bruising and tenderness at the needle site are common side effects and will resolve in a few days.  5. Persistent numbness or new problems with movement should be communicated to the surgeon or the [facility] [phone] [facility] [phone]).

## 2021-08-31 NOTE — Anesthesia Procedure Notes (Addendum)
Anesthesia Regional Block: Supraclavicular block   Pre-Anesthetic Checklist: , timeout performed,  Correct Patient, Correct Site, Correct Laterality,  Correct Procedure, Correct Position, site marked,  Risks and benefits discussed,  Surgical consent,  Pre-op evaluation,  At surgeon's request and post-op pain management  Laterality: Right  Prep: chloraprep       Needles:  Injection technique: Single-shot  Needle Type: Echogenic Stimulator Needle     Needle Length: 10cm  Needle Gauge: 21   Needle insertion depth: 7 cm   Additional Needles:   Procedures:,,,, ultrasound used (permanent image in chart),,   Motor weakness within 5 minutes.  Narrative:  Start time: 08/31/2021 9:37 AM End time: 08/31/2021 9:42 AM Injection made incrementally with aspirations every 5 mL.  Performed by: Personally  Anesthesiologist: Mal Amabile, MD  Additional Notes: Timeout performed. Patient sedated. Relevant anatomy ID'd using [place]. Incremental 2-38ml injection of LA with frequent aspiration. Patient tolerated procedure well.     Right Supraclavicular Block

## 2021-08-31 NOTE — Anesthesia Postprocedure Evaluation (Signed)
Anesthesia Post Note  Patient: Ivan Warner  Procedure(s) Performed: RIGHT ARTHROSCOPY ELBOW WITH REMOVAL OF LOOSE BODY (Right: Elbow)     Patient location during evaluation: PACU Anesthesia Type: Regional and General Level of consciousness: awake and alert and oriented Pain management: pain level controlled Vital Signs Assessment: post-procedure vital signs reviewed and stable Respiratory status: spontaneous breathing, nonlabored ventilation and respiratory function stable Cardiovascular status: blood pressure returned to baseline and stable Postop Assessment: no apparent nausea or vomiting Anesthetic complications: no   No notable events documented.  Last Vitals:  Vitals:   08/31/21 1300 08/31/21 1310  BP: (!) 128/92 (!) 139/98  Pulse: 69 74  Resp: 11 14  Temp:  (!) 36.2 C  SpO2: 96% 94%    Last Pain:  Vitals:   08/31/21 1300  TempSrc:   PainSc: 0-No pain                 Bailey Kolbe A.

## 2021-08-31 NOTE — Interval H&P Note (Signed)
History and Physical Interval Note:  08/31/2021 10:02 AM  Ivan Warner  has presented today for surgery, with the diagnosis of RIGHT ELBOW LOOSE BODY.  The various methods of treatment have been discussed with the patient and family. After consideration of risks, benefits and other options for treatment, the patient has consented to  Procedure(s): RIGHT ARTHROSCOPY ELBOW WITH REMOVAL OF LOOSE BODY (Right) as a surgical intervention.  The patient's history has been reviewed, patient examined, no change in status, stable for surgery.  I have reviewed the patient's chart and labs.  Questions were answered to the patient's satisfaction.     [name]

## 2021-08-31 NOTE — Progress Notes (Signed)
Assisted Dr. Foster with right, supraclavicular, ultrasound guided block. Side rails up, monitors on throughout procedure. See vital signs in flow sheet. Tolerated Procedure well. 

## 2021-08-31 NOTE — Op Note (Signed)
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  His coronoid and radial head fractures have healed but he has been left with a terminal loss of extension which has been bothering him..  The risk and benefits of the procedure were discussed in detail and documented in the pre-operative evaluation.   PREOPERATIVE DIAGNOSIS: 1.  Right elbow arthrofibrosis  POSTOPERATIVE DIAGNOSIS: Same.  PROCEDURE: 1.  Right elbow arthroscopy with anterior capsular release and removal of loose body  SURGEON: Benancio Deeds MD  ASSISTANT: Kerby Less, ATC  ANESTHESIA:  general plus interscalene nerve block  IV FLUIDS AND URINE: See anesthesia record.  ANTIBIOTICS: Ancef  ESTIMATED BLOOD LOSS: 10 mL.  IMPLANTS:  * No implants in log *  DRAINS: None  CULTURES: None  COMPLICATIONS: none  DESCRIPTION OF PROCEDURE:  Right elbow arthroscopy exam under anesthesia: Chondral surfaces are well-appearing Significant scarring in the anterior as well as posterior olecranon fossa Small loose body in the posterior radiocapitellar joint Range of motion was from 130 degrees of flexion to 15 degrees of extension with 70 degrees of pro and supination   Patient was identified in preoperative holding area.  The correct site was marked according to universal protocol.  Interscalene nerve block was performed.  He was subsequently taken back to the operating room.  Anesthesia was induced.  Appropriate antibiotics were given 1 hour prior to skin incision.  He was prepped and draped in usual sterile fashion in the lateral decubitus position with all bony prominences padded.  Final timeout was again performed.  I began with a diagnostic arthroscopy of the right elbow.  25 cc of normal saline was used to insufflate the joint.  An anterior medial incision was made just through skin and snap was used to  spread down to capsule.  At this time the joint was visualized and there was significant scar tissue involving the anterior humeral fossa.  Spinal needle was used to create the anterior lateral portal 2 cm proximal to the lateral epicondyle and an inside-out fashion.  Again 15 blade was used to incise just through skin.  Staff was used to spread down to the level of capsule and the shaver was introduced.  Collene Mares was used to debride the intra-articular scar tissue.  At this time the anterior capsule was identified.  A straight biter was used to carefully initially release muscle from the anterior capsule and then to debride and release the anterior capsule.  The camera was switched into the anterior lateral portal using a switching stick and the same procedure was utilized.  Care was taken to minimize suction of the shaver so as to minimize contraction of the joint.  Following this a posterior lateral portal was made in the soft spot.  The scope was introduced.  The radial head was visualized and his fracture was found to be healed.  A separate posterior lateral incision was made between the olecranon and radial head in order to access the space with a shaver.  A shaver was introduced and there was a small loose body that was then shaved free in this area.  Following this a manipulation was performed with the elbow near full extension following the anterior capsule release.  The fluid was evacuated and the wounds were closed with 3-0 nylon.  A soft dressing was applied with gauze Webril and Ace.  A sling was applied.  All counts were correct at the end of the case.  There were no complications.    POSTOPERATIVE PLAN: He will be weightbearing as tolerated on the right arm once his block wears off.  He will begin early aggressive range of motion with physical therapy.  I will plan to see him back in 2 weeks for suture removal  Benancio Deeds, MD 12:40 PM

## 2021-08-31 NOTE — Anesthesia Procedure Notes (Signed)
Procedure Name: LMA Insertion Date/Time: 08/31/2021 10:59 AM  Performed by: Garth Bigness, CRNAPre-anesthesia Checklist: Patient identified, Emergency Drugs available, Suction available and Patient being monitored Patient Re-evaluated:Patient Re-evaluated prior to induction Oxygen Delivery Method: Circle system utilized Preoxygenation: Pre-oxygenation with 100% oxygen Induction Type: IV induction Ventilation: Mask ventilation without difficulty LMA: LMA inserted LMA Size: 4.0 Tube type: Oral Number of attempts: 1 Placement Confirmation: ETT inserted through vocal cords under direct vision, positive ETCO2 and breath sounds checked- equal and bilateral Tube secured with: Tape Dental Injury: Teeth and Oropharynx as per pre-operative assessment

## 2021-09-01 ENCOUNTER — Encounter (HOSPITAL_BASED_OUTPATIENT_CLINIC_OR_DEPARTMENT_OTHER): Payer: Self-pay | Admitting: Orthopaedic Surgery

## 2021-09-01 NOTE — Addendum Note (Signed)
Addendum  created 09/01/21 1354 by Burna Cash, CRNA   Charge Capture section accepted

## 2021-09-06 ENCOUNTER — Encounter (HOSPITAL_BASED_OUTPATIENT_CLINIC_OR_DEPARTMENT_OTHER): Payer: BC Managed Care – PPO | Admitting: Orthopaedic Surgery

## 2021-09-13 ENCOUNTER — Ambulatory Visit (INDEPENDENT_AMBULATORY_CARE_PROVIDER_SITE_OTHER): Payer: BC Managed Care – PPO | Admitting: Orthopaedic Surgery

## 2021-09-13 DIAGNOSIS — S52121A Displaced fracture of head of right radius, initial encounter for closed fracture: Secondary | ICD-10-CM

## 2021-09-13 NOTE — Progress Notes (Signed)
Post Operative Evaluation    Procedure/Date of Surgery: Right elbow arthroscopy with 08/31/21  Interval History:   Presents today status post right elbow arthroscopy with anterior capsular release.  Overall he is continue to work aggressively with physical therapy.  He has regained some of his motion and is about to 3 degrees at this point.  He is getting fewer mechanical symptoms about the right elbow at this point which she is much happier with.   PMH/PSH/Family History/Social History/Meds/Allergies:    Past Medical History:  Diagnosis Date   Cervical stenosis of spine    Depression    Past Surgical History:  Procedure Laterality Date   arm surgery Right    CERVICAL SPINE SURGERY     ELBOW ARTHROSCOPY Right 08/31/2021   Procedure: RIGHT ARTHROSCOPY ELBOW WITH REMOVAL OF LOOSE BODY;  Surgeon: [name], MD;  Location: [facility];  Service: Orthopedics;  Laterality: Right;   Social History   Socioeconomic History   Marital status: Married    Spouse name: Not on file   Number of children: Not on file   Years of education: Not on file   Highest education level: Not on file  Occupational History   Not on file  Tobacco Use   Smoking status: Former    Packs/day: 1.00    Years: 21.00    Total pack years: 21.00    Types: Cigarettes    Quit date: 02/17/1999    Years since quitting: 22.5   Smokeless tobacco: Not on file  Vaping Use   Vaping Use: Never used  Substance and Sexual Activity   Alcohol use: Yes    Alcohol/week: 5.0 standard drinks of alcohol    Types: 5 Glasses of wine per week    Comment: occasionally   Drug use: Not Currently   Sexual activity: Yes  Other Topics Concern   Not on file  Social History Narrative   Not on file   Social Determinants of Health   Financial Resource Strain: Not on file  Food Insecurity: Not on file  Transportation Needs: Not on file  Physical Activity: Not on file   Stress: Not on file  Social Connections: Not on file   No family history on file. No Known Allergies Current Outpatient Medications  Medication Sig Dispense Refill   Cholecalciferol (VITAMIN D3) 50 MCG (2000 UT) TABS Take 2,000 Units by mouth every evening.     citalopram (CELEXA) 40 MG tablet Take 40 mg by mouth every evening.     diphenhydrAMINE (BENADRYL) 25 MG tablet Take 25 mg by mouth every 6 (six) hours as needed.     Multiple Minerals-Vitamins (CAL MAG ZINC +D3 PO) Take 2 tablets by mouth every evening.     oxyCODONE (ROXICODONE) 5 MG immediate release tablet Take 1 tablet (5 mg total) by mouth every 4 (four) hours as needed for severe pain or breakthrough pain. 10 tablet 0   rosuvastatin (CRESTOR) 5 MG tablet Take 5 mg by mouth every evening.     No current facility-administered medications for this visit.   No results found.  Review of Systems:   A ROS was performed including pertinent positives and negatives as documented in the HPI.   Musculoskeletal Exam:    There were no vitals taken for this visit.  Right elbow  portals are well-appearing range of motion is from 3 degrees to approximately 135.  No mechanical symptoms with full pro supination.  2+ radial pulse.  Sensation exam is intact throughout distally  Imaging:      I personally reviewed and interpreted the radiographs.   Assessment:   2 weeks status post right elbow arthroscopy with anterior capsular debridement overall doing extremely well.  At this time he will continue to work aggressively with physical therapy multiple times weekly.  I will plan to see him back in 6 weeks for a final range of motion check and assessment  Plan :    -Return to clinic in 6 weeks      I personally saw and evaluated the patient, and participated in the management and treatment plan.  [name], MD Attending Physician, Orthopedic Surgery  This document was dictated using Dragon voice recognition software. A  reasonable attempt at proof reading has been made to minimize errors.

## 2021-09-22 ENCOUNTER — Ambulatory Visit: Payer: BC Managed Care – PPO | Admitting: Adult Health

## 2021-10-06 ENCOUNTER — Ambulatory Visit: Payer: BC Managed Care – PPO

## 2021-10-06 DIAGNOSIS — R0683 Snoring: Secondary | ICD-10-CM

## 2021-10-06 DIAGNOSIS — G4733 Obstructive sleep apnea (adult) (pediatric): Secondary | ICD-10-CM | POA: Diagnosis not present

## 2021-10-11 DIAGNOSIS — G4733 Obstructive sleep apnea (adult) (pediatric): Secondary | ICD-10-CM | POA: Diagnosis not present

## 2021-10-25 ENCOUNTER — Encounter (HOSPITAL_BASED_OUTPATIENT_CLINIC_OR_DEPARTMENT_OTHER): Payer: BC Managed Care – PPO | Admitting: Orthopaedic Surgery

## 2021-10-27 ENCOUNTER — Ambulatory Visit (INDEPENDENT_AMBULATORY_CARE_PROVIDER_SITE_OTHER): Payer: BC Managed Care – PPO | Admitting: Orthopaedic Surgery

## 2021-10-27 DIAGNOSIS — S52121A Displaced fracture of head of right radius, initial encounter for closed fracture: Secondary | ICD-10-CM

## 2021-10-27 NOTE — Progress Notes (Signed)
Post Operative Evaluation    Procedure/Date of Surgery: Right elbow arthroscopy with 08/31/21  Interval History:   Presents today status post right elbow arthroscopy with anterior capsular release.  Overall he does continue to make improvements in range of motion.  This is somewhat plateaued.  He is continuing physical therapy at this time.   PMH/PSH/Family History/Social History/Meds/Allergies:    Past Medical History:  Diagnosis Date   Cervical stenosis of spine    Depression    Past Surgical History:  Procedure Laterality Date   arm surgery Right    CERVICAL SPINE SURGERY     ELBOW ARTHROSCOPY Right 08/31/2021   Procedure: RIGHT ARTHROSCOPY ELBOW WITH REMOVAL OF LOOSE BODY;  Surgeon: [name], MD;  Location: [facility];  Service: Orthopedics;  Laterality: Right;   Social History   Socioeconomic History   Marital status: Married    Spouse name: Not on file   Number of children: Not on file   Years of education: Not on file   Highest education level: Not on file  Occupational History   Not on file  Tobacco Use   Smoking status: Former    Packs/day: 1.00    Years: 21.00    Total pack years: 21.00    Types: Cigarettes    Quit date: 02/17/1999    Years since quitting: 22.7   Smokeless tobacco: Not on file  Vaping Use   Vaping Use: Never used  Substance and Sexual Activity   Alcohol use: Yes    Alcohol/week: 5.0 standard drinks of alcohol    Types: 5 Glasses of wine per week    Comment: occasionally   Drug use: Not Currently   Sexual activity: Yes  Other Topics Concern   Not on file  Social History Narrative   Not on file   Social Determinants of Health   Financial Resource Strain: Not on file  Food Insecurity: Not on file  Transportation Needs: Not on file  Physical Activity: Not on file  Stress: Not on file  Social Connections: Not on file   No family history on file. No Known Allergies Current  Outpatient Medications  Medication Sig Dispense Refill   Cholecalciferol (VITAMIN D3) 50 MCG (2000 UT) TABS Take 2,000 Units by mouth every evening.     citalopram (CELEXA) 40 MG tablet Take 40 mg by mouth every evening.     diphenhydrAMINE (BENADRYL) 25 MG tablet Take 25 mg by mouth every 6 (six) hours as needed.     Multiple Minerals-Vitamins (CAL MAG ZINC +D3 PO) Take 2 tablets by mouth every evening.     oxyCODONE (ROXICODONE) 5 MG immediate release tablet Take 1 tablet (5 mg total) by mouth every 4 (four) hours as needed for severe pain or breakthrough pain. 10 tablet 0   rosuvastatin (CRESTOR) 5 MG tablet Take 5 mg by mouth every evening.     No current facility-administered medications for this visit.   No results found.  Review of Systems:   A ROS was performed including pertinent positives and negatives as documented in the HPI.   Musculoskeletal Exam:    There were no vitals taken for this visit.  Right elbow portals are well-appearing range of motion is from 10 degrees to approximately 135.  No mechanical symptoms with full pro supination.  2+ radial pulse.  Sensation exam is intact throughout distally  Imaging:      I personally reviewed and interpreted the radiographs.   Assessment:   8 weeks status post right elbow arthroscopy and debridement overall doing very well.  His mechanical symptoms have resolved.  He does still have approximately 10 degrees loss of full extension.  At this time he will continue to work through physical therapy on this.  I will plan to see him back as needed  Plan :    -Return to clinic as needed      I personally saw and evaluated the patient, and participated in the management and treatment plan.  Vanetta Mulders, MD Attending Physician, Orthopedic Surgery  This document was dictated using Dragon voice recognition software. A reasonable attempt at proof reading has been made to minimize errors.

## 2021-11-01 ENCOUNTER — Encounter: Payer: Self-pay | Admitting: Adult Health

## 2021-11-01 ENCOUNTER — Ambulatory Visit (INDEPENDENT_AMBULATORY_CARE_PROVIDER_SITE_OTHER): Payer: BC Managed Care – PPO | Admitting: Adult Health

## 2021-11-01 VITALS — BP 126/70 | HR 73 | Temp 98.2°F | Ht 69.0 in | Wt 216.6 lb

## 2021-11-01 DIAGNOSIS — G4733 Obstructive sleep apnea (adult) (pediatric): Secondary | ICD-10-CM

## 2021-11-01 DIAGNOSIS — R0683 Snoring: Secondary | ICD-10-CM

## 2021-11-01 DIAGNOSIS — E669 Obesity, unspecified: Secondary | ICD-10-CM

## 2021-11-01 NOTE — Assessment & Plan Note (Signed)
Moderate obstructive sleep apnea.  Patient education given on sleep apnea and treatment options.  Patient will proceed with CPAP therapy.  We will begin auto CPAP 5-15.  We discussed mask.  He would like to try the DreamWear nasal mask.    - discussed how weight can impact sleep and risk for sleep disordered breathing - discussed options to assist with weight loss: combination of diet modification, cardiovascular and strength training exercises   - had an extensive discussion regarding the adverse health consequences related to untreated sleep disordered breathing - specifically discussed the risks for hypertension, coronary artery disease, cardiac dysrhythmias, cerebrovascular disease, and diabetes - lifestyle modification discussed   - discussed how sleep disruption can increase risk of accidents, particularly when driving - safe driving practices were discussed   Plan  Patient Instructions  Begin CPAP At bedtime , wear all night long for at least 6hr  Work on healthy weight  Do not drive if sleepy  May try dream wear nasal mask  Follow up in 3 months and As needed

## 2021-11-01 NOTE — Patient Instructions (Signed)
Begin CPAP At bedtime , wear all night long for at least 6hr  Work on healthy weight  Do not drive if sleepy  May try dream wear nasal mask  Follow up in 3 months and As needed

## 2021-11-01 NOTE — Assessment & Plan Note (Signed)
Weight loss discussed  

## 2021-11-01 NOTE — Progress Notes (Signed)
@Patient  ID: Ivan Warner, male    DOB: Nov 23, 1965, 56 y.o.   MRN: [id number]  Chief Complaint  Patient presents with   Follow-up    Referring provider: Merrilee Seashore, MD  HPI: 56 year old male seen for sleep consult August 10, 2021 for snoring and daytime sleepiness found to have moderate sleep apnea  TEST/EVENTS :  Home sleep study October 06, 2021 showed moderate sleep apnea with AHI at 16.9/hour and SPO2 low at 86%.  11/01/2021 Follow up : OSA  Returns for a 73-month follow-up.  Patient was seen last visit for sleep consult for snoring and daytime sleepiness.  He was set up for home sleep study that was completed on October 06, 2021 that showed moderate sleep apnea with AHI at 16.9/hour and SPO2 low at 86%.  We discussed his sleep study results in detail.  Went over treatment options including weight loss, oral appliance and CPAP. Patient has significant snoring, restless sleep and feels fatigued.  Snoring is keeping his wife up at night.  No Known Allergies  Immunization History  Administered Date(s) Administered   Influenza, Quadrivalent, Recombinant, Inj, Pf 11/08/2016, 11/01/2019   Influenza,inj,Quad PF,6+ Mos 10/16/2020   Janssen (J&J) SARS-COV-2 Vaccination 03/29/2019   Moderna Sars-Covid-2 Vaccination 11/12/2019   Tdap 05/12/2020   Zoster Recombinat (Shingrix) 05/12/2020, 07/14/2020    Past Medical History:  Diagnosis Date   Cervical stenosis of spine    Depression     Tobacco History: Social History   Tobacco Use  Smoking Status Former   Packs/day: 1.00   Years: 21.00   Total pack years: 21.00   Types: Cigarettes   Quit date: 02/17/1999   Years since quitting: 22.7  Smokeless Tobacco Not on file   Counseling given: Not Answered   Outpatient Medications Prior to Visit  Medication Sig Dispense Refill   Cholecalciferol (VITAMIN D3) 50 MCG (2000 UT) TABS Take 2,000 Units by mouth every evening.     citalopram (CELEXA) 40 MG tablet Take 40 mg by  mouth every evening.     Multiple Minerals-Vitamins (CAL MAG ZINC +D3 PO) Take 2 tablets by mouth every evening.     rosuvastatin (CRESTOR) 5 MG tablet Take 5 mg by mouth every evening.     diphenhydrAMINE (BENADRYL) 25 MG tablet Take 25 mg by mouth every 6 (six) hours as needed. (Patient not taking: Reported on 11/01/2021)     oxyCODONE (ROXICODONE) 5 MG immediate release tablet Take 1 tablet (5 mg total) by mouth every 4 (four) hours as needed for severe pain or breakthrough pain. (Patient not taking: Reported on 11/01/2021) 10 tablet 0   No facility-administered medications prior to visit.     Review of Systems:   Constitutional:   No  weight loss, night sweats,  Fevers, chills,  +fatigue, or  lassitude.  HEENT:   No headaches,  Difficulty swallowing,  Tooth/dental problems, or  Sore throat,                No sneezing, itching, ear ache, nasal congestion, post nasal drip,   CV:  No chest pain,  Orthopnea, PND, swelling in lower extremities, anasarca, dizziness, palpitations, syncope.   GI  No heartburn, indigestion, abdominal pain, nausea, vomiting, diarrhea, change in bowel habits, loss of appetite, bloody stools.   Resp: No shortness of breath with exertion or at rest.  No excess mucus, no productive cough,  No non-productive cough,  No coughing up of blood.  No change in color of mucus.  No wheezing.  No chest wall deformity  Skin: no rash or lesions.  GU: no dysuria, change in color of urine, no urgency or frequency.  No flank pain, no hematuria   MS:  No joint pain or swelling.  No decreased range of motion.  No back pain.    Physical Exam  BP 126/70 (BP Location: Left Arm, Patient Position: Sitting, Cuff Size: Normal)   Pulse 73   Temp 98.2 F (36.8 C) (Oral)   Ht 5\' 9"  (1.753 m)   Wt 216 lb 9.6 oz (98.2 kg)   SpO2 98%   BMI 31.99 kg/m   GEN: A/Ox3; pleasant , NAD, well nourished    HEENT:  [place]/AT,  NOSE-clear, THROAT-clear, no lesions, no postnasal drip or  exudate noted.  Class II MP airway, elongated uvula  NECK:  Supple w/ fair ROM; no JVD; normal carotid impulses w/o bruits; no thyromegaly or nodules palpated; no lymphadenopathy.    RESP  Clear  P & A; w/o, wheezes/ rales/ or rhonchi. no accessory muscle use, no dullness to percussion  CARD:  RRR, no m/r/g, no peripheral edema, pulses intact, no cyanosis or clubbing.  GI:   Soft & nt; nml bowel sounds; no organomegaly or masses detected.   Musco: Warm bil, no deformities or joint swelling noted.   Neuro: alert, no focal deficits noted.    Skin: Warm, no lesions or rashes    Lab Results:  CBC  No results found for: "PROBNP"  Imaging: No results found.        No data to display          No results found for: "NITRICOXIDE"      Assessment & Plan:   OSA (obstructive sleep apnea) Moderate obstructive sleep apnea.  Patient education given on sleep apnea and treatment options.  Patient will proceed with CPAP therapy.  We will begin auto CPAP 5-15.  We discussed mask.  He would like to try the DreamWear nasal mask.    - discussed how weight can impact sleep and risk for sleep disordered breathing - discussed options to assist with weight loss: combination of diet modification, cardiovascular and strength training exercises   - had an extensive discussion regarding the adverse health consequences related to untreated sleep disordered breathing - specifically discussed the risks for hypertension, coronary artery disease, cardiac dysrhythmias, cerebrovascular disease, and diabetes - lifestyle modification discussed   - discussed how sleep disruption can increase risk of accidents, particularly when driving - safe driving practices were discussed   Plan  Patient Instructions  Begin CPAP At bedtime , wear all night long for at least 6hr  Work on healthy weight  Do not drive if sleepy  May try dream wear nasal mask  Follow up in 3 months and As needed         Obesity (BMI 30.0-34.9) Weight loss discussed    05-21-1992, NP 11/01/2021

## 2021-11-03 NOTE — Progress Notes (Signed)
Reviewed and agree with assessment/plan.   Lyndal Reggio, MD [facility] 11/03/2021, 9:19 AM Pager:  [phone]  

## 2022-02-01 ENCOUNTER — Encounter: Payer: Self-pay | Admitting: Adult Health

## 2022-02-01 ENCOUNTER — Ambulatory Visit (INDEPENDENT_AMBULATORY_CARE_PROVIDER_SITE_OTHER): Payer: BC Managed Care – PPO | Admitting: Adult Health

## 2022-02-01 VITALS — BP 132/80 | HR 85 | Temp 98.3°F | Ht 69.0 in | Wt 223.6 lb

## 2022-02-01 DIAGNOSIS — G4733 Obstructive sleep apnea (adult) (pediatric): Secondary | ICD-10-CM | POA: Diagnosis not present

## 2022-02-01 DIAGNOSIS — E669 Obesity, unspecified: Secondary | ICD-10-CM | POA: Diagnosis not present

## 2022-02-01 NOTE — Progress Notes (Signed)
@Patient  ID: Ivan Warner, male    DOB: [birth year], 57 y.o.   MRN: [id number]  Chief Complaint  Patient presents with   Follow-up    Referring provider: Merrilee Seashore, MD  HPI: 57 year old male seen for sleep consult August 10, 2021 for snoring and daytime sleepiness found to have moderate sleep apnea  TEST/EVENTS :  Home sleep study October 06, 2021 showed moderate sleep apnea with AHI at 16.9/hour and SPO2 low at 86%.   02/01/2022 Follow up ; OSA  Patient returns for 84-month follow-up.  Patient was seen in July for sleep consult with daytime sleepiness and snoring.  He was set up for home sleep study September 2023 3 that showed moderate sleep apnea.  Patient was started on CPAP last visit.  Patient says he is doing well on CPAP .  Says that he is wearing his CPAP every single night.  Has gotten used to it.  Feels that he is benefiting from CPAP with decreased daytime sleepiness.  CPAP download shows excellent compliance with 100% usage.  Daily average usage at 8 hours.  Patient is on auto CPAP 5 to 15 cm H2O.  AHI 3.9/hour.  No Known Allergies  Immunization History  Administered Date(s) Administered   Influenza, Quadrivalent, Recombinant, Inj, Pf 11/08/2016, 11/01/2019   Influenza,inj,Quad PF,6+ Mos 10/16/2020, 12/31/2021   Janssen (J&J) SARS-COV-2 Vaccination 03/29/2019   Moderna SARS-COV2 Booster Vaccination 12/31/2021   Moderna Sars-Covid-2 Vaccination 11/12/2019   Tdap 05/12/2020   Zoster Recombinat (Shingrix) 05/12/2020, 07/14/2020    Past Medical History:  Diagnosis Date   Cervical stenosis of spine    Depression     Tobacco History: Social History   Tobacco Use  Smoking Status Former   Packs/day: 1.00   Years: 21.00   Total pack years: 21.00   Types: Cigarettes   Quit date: 02/17/1999   Years since quitting: 22.9  Smokeless Tobacco Not on file   Counseling given: Not Answered   Outpatient Medications Prior to Visit  Medication Sig Dispense Refill    Cholecalciferol (VITAMIN D3) 50 MCG (2000 UT) TABS Take 2,000 Units by mouth every evening.     citalopram (CELEXA) 40 MG tablet Take 40 mg by mouth every evening.     Multiple Minerals-Vitamins (CAL MAG ZINC +D3 PO) Take 2 tablets by mouth every evening.     rosuvastatin (CRESTOR) 5 MG tablet Take 5 mg by mouth every evening.     diphenhydrAMINE (BENADRYL) 25 MG tablet Take 25 mg by mouth every 6 (six) hours as needed. (Patient not taking: Reported on 11/01/2021)     oxyCODONE (ROXICODONE) 5 MG immediate release tablet Take 1 tablet (5 mg total) by mouth every 4 (four) hours as needed for severe pain or breakthrough pain. (Patient not taking: Reported on 11/01/2021) 10 tablet 0   No facility-administered medications prior to visit.     Review of Systems:   Constitutional:   No  weight loss, night sweats,  Fevers, chills, fatigue, or  lassitude.  HEENT:   No headaches,  Difficulty swallowing,  Tooth/dental problems, or  Sore throat,                No sneezing, itching, ear ache, nasal congestion, post nasal drip,   CV:  No chest pain,  Orthopnea, PND, swelling in lower extremities, anasarca, dizziness, palpitations, syncope.   GI  No heartburn, indigestion, abdominal pain, nausea, vomiting, diarrhea, change in bowel habits, loss of appetite, bloody stools.   Resp: No shortness of  breath with exertion or at rest.  No excess mucus, no productive cough,  No non-productive cough,  No coughing up of blood.  No change in color of mucus.  No wheezing.  No chest wall deformity  Skin: no rash or lesions.  GU: no dysuria, change in color of urine, no urgency or frequency.  No flank pain, no hematuria   MS:  No joint pain or swelling.  No decreased range of motion.  No back pain.    Physical Exam  BP 132/80 (BP Location: Left Arm, Patient Position: Sitting, Cuff Size: Large)   Pulse 85   Temp 98.3 F (36.8 C) (Oral)   Ht 5\' 9"  (1.753 m)   Wt 223 lb 9.6 oz (101.4 kg)   SpO2 96%   BMI  33.02 kg/m   GEN: A/Ox3; pleasant , NAD, well nourished    HEENT:  [place]/AT,   NOSE-clear, THROAT-clear, no lesions, no postnasal drip or exudate noted.  Class II MP airway  NECK:  Supple w/ fair ROM; no JVD; normal carotid impulses w/o bruits; no thyromegaly or nodules palpated; no lymphadenopathy.    RESP  Clear  P & A; w/o, wheezes/ rales/ or rhonchi. no accessory muscle use, no dullness to percussion  CARD:  RRR, no m/r/g, no peripheral edema, pulses intact, no cyanosis or clubbing.  GI:   Soft & nt; nml bowel sounds; no organomegaly or masses detected.   Musco: Warm bil, no deformities or joint swelling noted.   Neuro: alert, no focal deficits noted.    Skin: Warm, no lesions or rashes    Lab Results:  CBC  ProBNP No results found for: "PROBNP"  Imaging: No results found.        No data to display          No results found for: "NITRICOXIDE"      Assessment & Plan:   OSA (obstructive sleep apnea) Excellent control and compliance on CPAP  Plan  Patient Instructions  Continue CPAP At bedtime , wear all night long for at least 6hr  Work on healthy weight  Do not drive if sleepy  Saline nasal spray Twice daily  As needed   Saline nasal gel At bedtime   Follow up in 4-6 months and As needed      Obesity (BMI 30.0-34.9) Healthy weight loss discussed     Rexene Edison, NP 02/01/2022

## 2022-02-01 NOTE — Assessment & Plan Note (Signed)
Excellent control and compliance on CPAP  Plan  Patient Instructions  Continue CPAP At bedtime , wear all night long for at least 6hr  Work on healthy weight  Do not drive if sleepy  Saline nasal spray Twice daily  As needed   Saline nasal gel At bedtime   Follow up in 4-6 months and As needed

## 2022-02-01 NOTE — Assessment & Plan Note (Signed)
Healthy weight loss discussed.  

## 2022-02-01 NOTE — Patient Instructions (Signed)
Continue CPAP At bedtime , wear all night long for at least 6hr  Work on healthy weight  Do not drive if sleepy  Saline nasal spray Twice daily  As needed   Saline nasal gel At bedtime   Follow up in 4-6 months and As needed

## 2022-02-02 NOTE — Progress Notes (Signed)
Reviewed and agree with assessment/plan.   Keeshawn Fakhouri, MD  Pulmonary/Critical Care 02/02/2022, 10:37 AM Pager:  [phone]  

## 2022-06-02 ENCOUNTER — Encounter: Payer: Self-pay | Admitting: Adult Health

## 2022-06-02 ENCOUNTER — Ambulatory Visit (INDEPENDENT_AMBULATORY_CARE_PROVIDER_SITE_OTHER): Payer: BC Managed Care – PPO | Admitting: Adult Health

## 2022-06-02 VITALS — BP 112/66 | HR 90 | Ht 69.0 in | Wt 221.2 lb

## 2022-06-02 DIAGNOSIS — G4733 Obstructive sleep apnea (adult) (pediatric): Secondary | ICD-10-CM | POA: Diagnosis not present

## 2022-06-02 DIAGNOSIS — J310 Chronic rhinitis: Secondary | ICD-10-CM | POA: Diagnosis not present

## 2022-06-02 NOTE — Progress Notes (Signed)
@Patient  ID: Ivan Warner, male    DOB: [birth year], 57 y.o.   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     06/02/2022 Follow up ; OSA  Patient returns for a 13-month follow-up.  Patient was diagnosed with sleep apnea in September 2023.  Started on CPAP.  Patient says he is doing well on CPAP.  Wears a CPAP every single night.  Feels that he is benefiting from CPAP with decreased daytime sleepiness.  He is using a DreamWear nasal mask.  CPAP download shows excellent compliance with 100% usage.  Daily average usage at 7.5 hours.  Patient is on auto CPAP 5 to 15 cm H2O.  AHI is 4.5/hour and daily average pressure 11.3 cm H2O.  Patient says at times he does feel the pressure is not quite strong enough. Patient complains of daily chronic nasal stuffiness.  Feels that his nose is always stopped up.  Occasionally uses some saline nasal spray.  Patient says this has been going on lifelong.  Is currently not taking any allergy medicines.  Patient says the nasal stuffiness is starting to get severe at times.   No Known Allergies  Immunization History  Administered Date(s) Administered   Influenza, Quadrivalent, Recombinant, Inj, Pf 11/08/2016, 11/01/2019   Influenza,inj,Quad PF,6+ Mos 10/16/2020, 12/31/2021   Janssen (J&J) SARS-COV-2 Vaccination 03/29/2019   Moderna SARS-COV2 Booster Vaccination 12/31/2021   Moderna Sars-Covid-2 Vaccination 11/12/2019   Tdap 05/12/2020   Zoster Recombinat (Shingrix) 05/12/2020, 07/14/2020    Past Medical History:  Diagnosis Date   Cervical stenosis of spine    Depression      Tobacco History: Social History   Tobacco Use  Smoking Status Former   Packs/day: 1.00   Years: 21.00   Additional pack years: 0.00   Total pack years: 21.00   Types: Cigarettes   Quit date: 02/17/1999   Years since quitting: 23.3  Smokeless Tobacco Not on file   Counseling given: Not Answered   Outpatient Medications Prior to Visit  Medication Sig Dispense Refill   citalopram (CELEXA) 40 MG tablet Take 40 mg by mouth every evening.     Multiple Minerals-Vitamins (CAL MAG ZINC +D3 PO) Take 2 tablets by mouth every evening.     rosuvastatin (CRESTOR) 5 MG tablet Take 5 mg by mouth every evening.     Cholecalciferol (VITAMIN D3) 50 MCG (2000 UT) TABS Take 2,000 Units by mouth every evening.     No facility-administered medications prior to visit.     Review of Systems:   Constitutional:   No  weight loss, night sweats,  Fevers, chills, fatigue, or  lassitude.  HEENT:   No headaches,  Difficulty swallowing,  Tooth/dental problems, or  Sore throat,                No sneezing, itching, ear ache,  +nasal congestion, post nasal drip,   CV:  No chest pain,  Orthopnea, PND, swelling in lower extremities, anasarca, dizziness, palpitations, syncope.   GI  No heartburn, indigestion, abdominal pain, nausea, vomiting, diarrhea, change in bowel habits, loss of appetite, bloody stools.  Resp: No shortness of breath with exertion or at rest.  No excess mucus, no productive cough,  No non-productive cough,  No coughing up of blood.  No change in color of mucus.  No wheezing.  No chest wall deformity  Skin: no rash or lesions.  GU: no dysuria, change in color of urine, no urgency or frequency.  No flank pain, no hematuria   MS:  No joint pain or swelling.  No decreased range of motion.  No back pain.    Physical Exam  BP 112/66 (BP Location: Left Arm, Patient Position: Sitting, Cuff Size: Normal)   Pulse 90   Ht 5\' 9"  (1.753 m)   Wt 221 lb 3.2 oz (100.3 kg)   SpO2 96%    BMI 32.67 kg/m   GEN: A/Ox3; pleasant , NAD, well nourished    HEENT:  [place]/AT,  EACs-clear, TMs-wnl, NOSE-clear,drainage , pale mucosa  THROAT-clear, no lesions, no postnasal drip or exudate noted. Class 2 MP airway   NECK:  Supple w/ fair ROM; no JVD; normal carotid impulses w/o bruits; no thyromegaly or nodules palpated; no lymphadenopathy.    RESP  Clear  P & A; w/o, wheezes/ rales/ or rhonchi. no accessory muscle use, no dullness to percussion  CARD:  RRR, no m/r/g, no peripheral edema, pulses intact, no cyanosis or clubbing.  GI:   Soft & nt; nml bowel sounds; no organomegaly or masses detected.   Musco: Warm bil, no deformities or joint swelling noted.   Neuro: alert, no focal deficits noted.    Skin: Warm, no lesions or rashes    Lab Results:  CBC  BNP No results found for: "BNP"  ProBNP No results found for: "PROBNP"  Imaging: No results found.        No data to display          No results found for: "NITRICOXIDE"      Assessment & Plan:   OSA (obstructive sleep apnea) Excellent control and compliance on nocturnal CPAP.  Will adjust auto CPAP for comfort.  Change to auto CPAP 8 to 15 cm H2O.   Plan  Patient Instructions  Begin Allegra 180mg  daily for 2 weeks and then As needed   Saline nasal rinses Twice daily  As needed   Saline nasal gel At bedtime   Flonase 1 puff daily for 2 weeks and then as needed  Refer to ENT   Continue CPAP At bedtime , wear all night long for at least 6hr  Work on healthy weight  Do not drive if sleepy  Follow up in 1 year and As needed       Chronic rhinitis Chronic rhinitis with increased symptom burden.  Add in [place] and Flonase.  Saline nasal rinses.  Refer to ENT per patient request   Plan  Patient Instructions  Begin Allegra 180mg  daily for 2 weeks and then As needed   Saline nasal rinses Twice daily  As needed   Saline nasal gel At bedtime   Flonase 1 puff daily for 2 weeks and then as needed   Refer to ENT   Continue CPAP At bedtime , wear all night long for at least 6hr  Work on healthy weight  Do not drive if sleepy  Follow up in 1 year and As needed         Ivan Oaks, NP 06/02/2022

## 2022-06-02 NOTE — Assessment & Plan Note (Signed)
Chronic rhinitis with increased symptom burden.  Add in [place] and Flonase.  Saline nasal rinses.  Refer to ENT per patient request   Plan  Patient Instructions  Begin Allegra 180mg  daily for 2 weeks and then As needed   Saline nasal rinses Twice daily  As needed   Saline nasal gel At bedtime   Flonase 1 puff daily for 2 weeks and then as needed  Refer to ENT   Continue CPAP At bedtime , wear all night long for at least 6hr  Work on healthy weight  Do not drive if sleepy  Follow up in 1 year and As needed

## 2022-06-02 NOTE — Patient Instructions (Signed)
Begin Allegra 180mg  daily for 2 weeks and then As needed   Saline nasal rinses Twice daily  As needed   Saline nasal gel At bedtime   Flonase 1 puff daily for 2 weeks and then as needed  Refer to ENT   Continue CPAP At bedtime , wear all night long for at least 6hr  Work on healthy weight  Do not drive if sleepy  Follow up in 1 year and As needed

## 2022-06-02 NOTE — Assessment & Plan Note (Signed)
Excellent control and compliance on nocturnal CPAP.  Will adjust auto CPAP for comfort.  Change to auto CPAP 8 to 15 cm H2O.   Plan  Patient Instructions  Begin Allegra 180mg  daily for 2 weeks and then As needed   Saline nasal rinses Twice daily  As needed   Saline nasal gel At bedtime   Flonase 1 puff daily for 2 weeks and then as needed  Refer to ENT   Continue CPAP At bedtime , wear all night long for at least 6hr  Work on healthy weight  Do not drive if sleepy  Follow up in 1 year and As needed

## 2022-06-02 NOTE — Progress Notes (Signed)
Reviewed and agree with assessment/plan.   Harald Quevedo, MD [place] [facility] 06/02/2022, 3:17 PM Pager:  [phone]  

## 2022-06-02 NOTE — Addendum Note (Signed)
Addended by: Delrae Rend on: 06/02/2022 10:44 AM   Modules accepted: Orders

## 2023-04-18 ENCOUNTER — Ambulatory Visit (INDEPENDENT_AMBULATORY_CARE_PROVIDER_SITE_OTHER): Payer: BC Managed Care – PPO | Admitting: Otolaryngology

## 2023-04-18 ENCOUNTER — Encounter (INDEPENDENT_AMBULATORY_CARE_PROVIDER_SITE_OTHER): Payer: Self-pay

## 2023-04-18 VITALS — BP 150/74 | HR 96 | Ht 69.0 in | Wt 223.0 lb

## 2023-04-18 DIAGNOSIS — J310 Chronic rhinitis: Secondary | ICD-10-CM | POA: Diagnosis not present

## 2023-04-18 DIAGNOSIS — J343 Hypertrophy of nasal turbinates: Secondary | ICD-10-CM

## 2023-04-18 DIAGNOSIS — R0981 Nasal congestion: Secondary | ICD-10-CM | POA: Diagnosis not present

## 2023-04-18 DIAGNOSIS — J342 Deviated nasal septum: Secondary | ICD-10-CM

## 2023-04-18 NOTE — Progress Notes (Unsigned)
 Patient ID: Ivan Warner, male   DOB: Jan 29, 1965, 58 y.o.   MRN: [id number]  Follow-up: Chronic nasal obstruction  HPI: The patient is a 58 year old male who returns today for his follow-up evaluation.  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  He was previously treated with Flonase nasal spray and allergy medications.  The patient returns today complaining of persistent nasal obstruction.  He is using CPAP machine at night for his sleep apnea.  He has occasional difficulty with the CPAP machine, secondary to the nasal obstruction.  He denies any facial pain, fever, or visual change.  Exam: General: Communicates without difficulty, well nourished, no acute distress. Head: Normocephalic, no evidence injury, no tenderness, facial buttresses intact without stepoff. Face/sinus: No tenderness to palpation and percussion. Facial movement is normal and symmetric. Eyes: PERRL, EOMI. No scleral icterus, conjunctivae clear. Neuro: CN II exam reveals vision grossly intact.  No nystagmus at any point of gaze. Ears: Auricles well formed without lesions.  Ear canals are intact without mass or lesion.  No erythema or edema is appreciated.  The TMs are intact without fluid. Nose: External evaluation reveals normal support and skin without lesions.  Dorsum is intact.  Anterior rhinoscopy reveals congested mucosa over anterior aspect of inferior turbinates and deviated septum.  No purulence noted. Oral:  Oral cavity and oropharynx are intact, symmetric, without erythema or edema.  Mucosa is moist without lesions. Neck: Full range of motion without pain.  There is no significant lymphadenopathy.  No masses palpable.  Thyroid bed within normal limits to palpation.  Parotid glands and submandibular glands equal bilaterally without mass.  Trachea is midline. Neuro:  CN 2-12 grossly intact.   Assessment: 1.  Chronic rhinitis with nasal  mucosal congestion, nasal septal deviation, and bilateral inferior turbinate hypertrophy.  More than 95% of his nasal passageways are obstructed bilaterally. 2.  The patient continues to be symptomatic, despite medical treatment with Flonase nasal spray and allergy medications.  Plan: 1.  The nasal endoscopy findings are reviewed with the patient. 2.  Continue with Flonase nasal spray 2 sprays each nostril daily. 3.  In light of his persistent symptoms, he may benefit from surgical intervention.  The options of septoplasty and turbinate reduction to improve his nasal passageways are discussed.  The risk, benefits, and details of the procedures are extensively reviewed.  Questions are invited and answered. 4.  The patient will like to proceed with the procedures.  We will schedule procedures in accordance with the patient's schedule.

## 2023-04-19 DIAGNOSIS — J342 Deviated nasal septum: Secondary | ICD-10-CM | POA: Insufficient documentation

## 2023-04-19 DIAGNOSIS — J343 Hypertrophy of nasal turbinates: Secondary | ICD-10-CM | POA: Insufficient documentation

## 2023-06-02 ENCOUNTER — Ambulatory Visit: Payer: BC Managed Care – PPO | Admitting: Adult Health

## 2023-06-05 ENCOUNTER — Encounter: Payer: Self-pay | Admitting: Adult Health

## 2023-06-05 ENCOUNTER — Ambulatory Visit: Payer: BC Managed Care – PPO | Admitting: Adult Health

## 2023-06-05 VITALS — BP 138/82 | HR 78 | Ht 69.0 in | Wt 223.0 lb

## 2023-06-05 DIAGNOSIS — G4733 Obstructive sleep apnea (adult) (pediatric): Secondary | ICD-10-CM

## 2023-06-05 DIAGNOSIS — J310 Chronic rhinitis: Secondary | ICD-10-CM | POA: Diagnosis not present

## 2023-06-05 NOTE — Assessment & Plan Note (Signed)
 Continue on current regimen  Follow up with ENT as planned

## 2023-06-05 NOTE — Patient Instructions (Addendum)
 Continue CPAP At bedtime , wear all night long for at least 6hr  CPAP pressure today.  Work on healthy weight  Do not drive if sleepy  Keep up good work.  Saline nasal rinses Twice daily   Saline nasal gel At bedtime   Follow up in 1 year and As needed

## 2023-06-05 NOTE — Progress Notes (Signed)
 @Patient  ID: Ivan Warner, male    DOB: 1965-08-10, 58 y.o.   MRN: [id number]  Chief Complaint  Patient presents with   Follow-up    Referring provider: Virgle Grime, MD  HPI: 58 year old male seen for sleep consult July 2023 for snoring daytime sleepiness found to have moderate obstructive sleep apnea -started on CPAP nocturnally [employer] online classes  TEST/EVENTS :  Home sleep study October 06, 2021 showed moderate sleep apnea with AHI at 16.9/hour and SPO2 low at 86%.   06/05/2023 Follow up ; OSA  Patient presents for 1 year follow-up.  Patient was diagnosed with sleep apnea in September 2023 and started on CPAP therapy.  Patient says he is benefiting from CPAP with decreased daytime sleepiness.  Uses a DreamWear nasal mask.  CPAP download shows excellent compliance with 100% usage.  Daily average usage at 7 hours.  Patient is on auto CPAP 8 to 15 cm H2O.  AHI is 1.8hour. DME is [facility] .  Feels the pressure is not strong enough at time  Working with ENT for chronic rhinitis. Deciding on sinus surgery.  Currently on Flonase with limited benefit.    No Known Allergies  Immunization History  Administered Date(s) Administered   Influenza, Quadrivalent, Recombinant, Inj, Pf 11/08/2016, 11/01/2019   Influenza,inj,Quad PF,6+ Mos 10/16/2020, 12/31/2021   Janssen (J&J) SARS-COV-2 Vaccination 03/29/2019   Moderna SARS-COV2 Booster Vaccination 12/31/2021   Moderna Sars-Covid-2 Vaccination 11/12/2019   Tdap 05/12/2020   Zoster Recombinant(Shingrix) 05/12/2020, 07/14/2020    Past Medical History:  Diagnosis Date   Cervical stenosis of spine    Depression     Tobacco History: Social History   Tobacco Use  Smoking Status Former   Current packs/day: 0.00   Average packs/day: 1 pack/day for 21.0 years (21.0 ttl pk-yrs)   Types: Cigarettes   Start date: 02/16/1978   Quit date: 02/17/1999   Years since quitting: 24.3  Smokeless Tobacco Not on file    Counseling given: Not Answered   Outpatient Medications Prior to Visit  Medication Sig Dispense Refill   fluticasone (FLONASE) 50 MCG/ACT nasal spray Place into both nostrils daily.     amphetamine-dextroamphetamine (ADDERALL) 15 MG tablet Take 1 tablet by mouth 2 (two) times daily.     Cholecalciferol (VITAMIN D3) 50 MCG (2000 UT) TABS Take 2,000 Units by mouth every evening.     citalopram  (CELEXA ) 40 MG tablet Take 40 mg by mouth every evening.     Multiple Minerals-Vitamins (CAL MAG ZINC +D3 PO) Take 2 tablets by mouth every evening.     rosuvastatin (CRESTOR) 5 MG tablet Take 5 mg by mouth every evening.     No facility-administered medications prior to visit.     Review of Systems:   Constitutional:   No  weight loss, night sweats,  Fevers, chills, fatigue, or  lassitude.  HEENT:   No headaches,  Difficulty swallowing,  Tooth/dental problems, or  Sore throat,                No sneezing, itching, ear ache,+ nasal congestion, post nasal drip,   CV:  No chest pain,  Orthopnea, PND, swelling in lower extremities, anasarca, dizziness, palpitations, syncope.   GI  No heartburn, indigestion, abdominal pain, nausea, vomiting, diarrhea, change in bowel habits, loss of appetite, bloody stools.   Resp: No shortness of breath with exertion or at rest.  No excess mucus, no productive cough,  No non-productive cough,  No coughing up of blood.  No change  in color of mucus.  No wheezing.  No chest wall deformity  Skin: no rash or lesions.  GU: no dysuria, change in color of urine, no urgency or frequency.  No flank pain, no hematuria   MS:  No joint pain or swelling.  No decreased range of motion.  No back pain.    Physical Exam  BP 138/82 (BP Location: Left Arm, Patient Position: Sitting, Cuff Size: Normal)   Pulse 78   Ht 5\' 9"  (1.753 m)   Wt 223 lb (101.2 kg)   SpO2 95%   BMI 32.93 kg/m   GEN: A/Ox3; pleasant , NAD, well nourished    HEENT:  [place]/AT,  NOSE-clear,  THROAT-clear, no lesions, no postnasal drip or exudate noted.   NECK:  Supple w/ fair ROM; no JVD; normal carotid impulses w/o bruits; no thyromegaly or nodules palpated; no lymphadenopathy.    RESP  Clear  P & A; w/o, wheezes/ rales/ or rhonchi. no accessory muscle use, no dullness to percussion  CARD:  RRR, no m/r/g, no peripheral edema, pulses intact, no cyanosis or clubbing.  GI:   Soft & nt; nml bowel sounds; no organomegaly or masses detected.   Musco: Warm bil, no deformities or joint swelling noted.   Neuro: alert, no focal deficits noted.    Skin: Warm, no lesions or rashes    Lab Results:  CBC  BNP No results found for: "BNP"  ProBNP No results found for: "PROBNP"  Imaging: No results found.  Administration History     None           No data to display          No results found for: "NITRICOXIDE"      Assessment & Plan:   OSA (obstructive sleep apnea) Excellent control and compliance on CPAP  Adjust CPAP pressure for comfort, change auto CPAP 11-15cmH2O   Plan  Patient Instructions  Continue CPAP At bedtime , wear all night long for at least 6hr  Work on healthy weight  Do not drive if sleepy  Keep up good work.  Follow up in 1 year and As needed   '   Chronic rhinitis Continue on current regimen  Follow up with ENT as planned     Roena Clark, NP 06/05/2023

## 2023-06-05 NOTE — Assessment & Plan Note (Addendum)
 Excellent control and compliance on CPAP  Adjust CPAP pressure for comfort, change auto CPAP 11-15cmH2O   Plan  Patient Instructions  Continue CPAP At bedtime , wear all night long for at least 6hr  Work on healthy weight  Do not drive if sleepy  Keep up good work.  Follow up in 1 year and As needed   '

## 2023-09-29 IMAGING — DX DG ELBOW COMPLETE 3+V*R*
4 series · 4 of 4 positions shown · non-contrast
Comparison: None.

CLINICAL DATA: Fall, right elbow pain

EXAM:
RIGHT ELBOW - COMPLETE 3+ VIEW

[elbow ap]
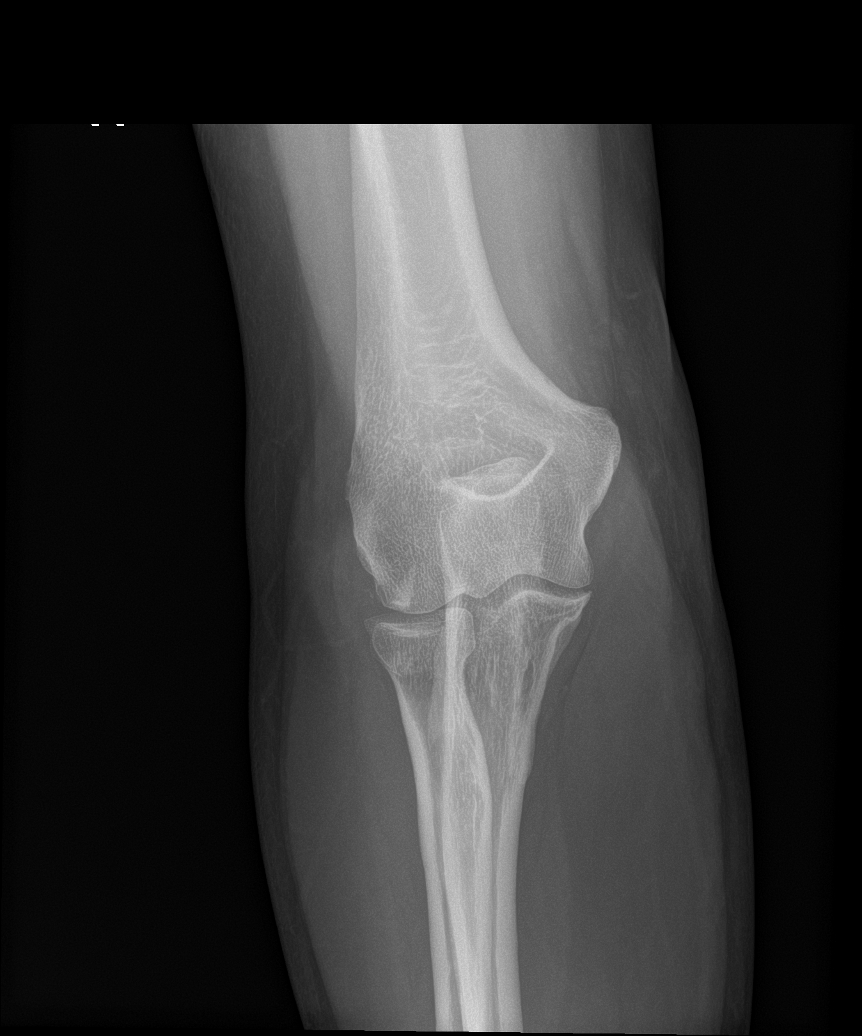

[elbow obl (1 of 2)]
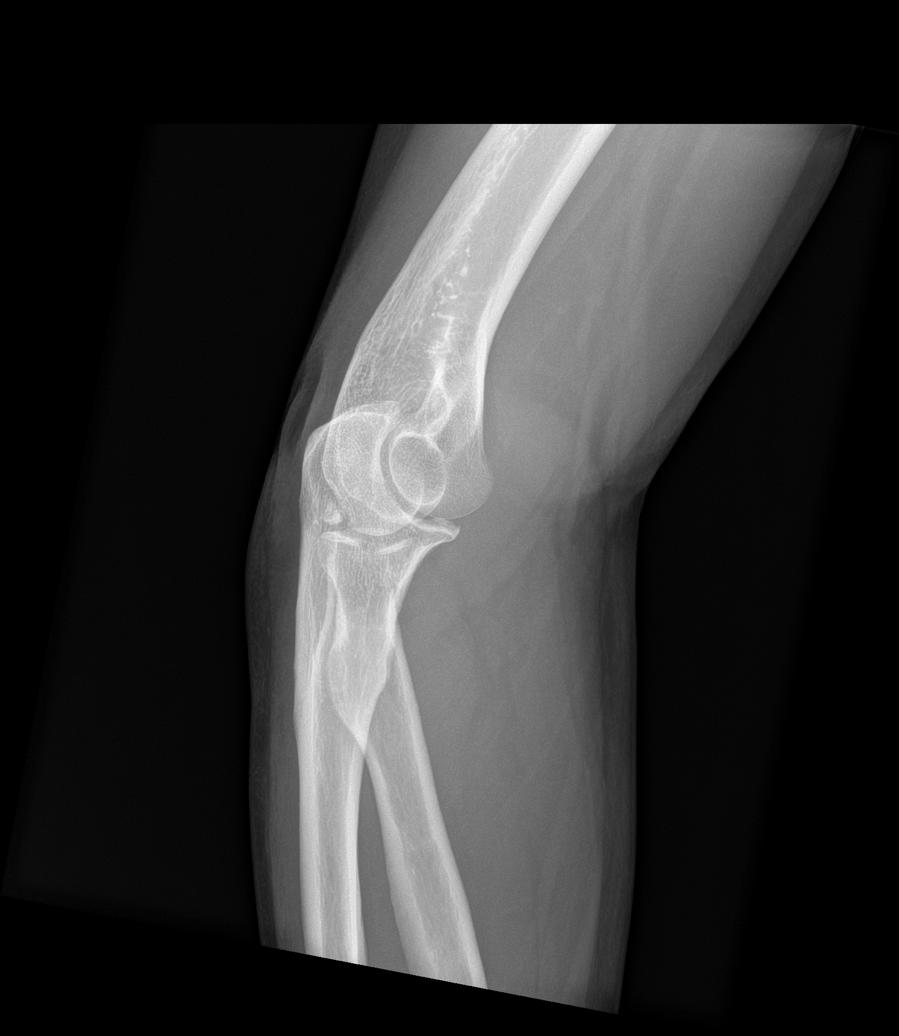

[elbow obl (2 of 2)]
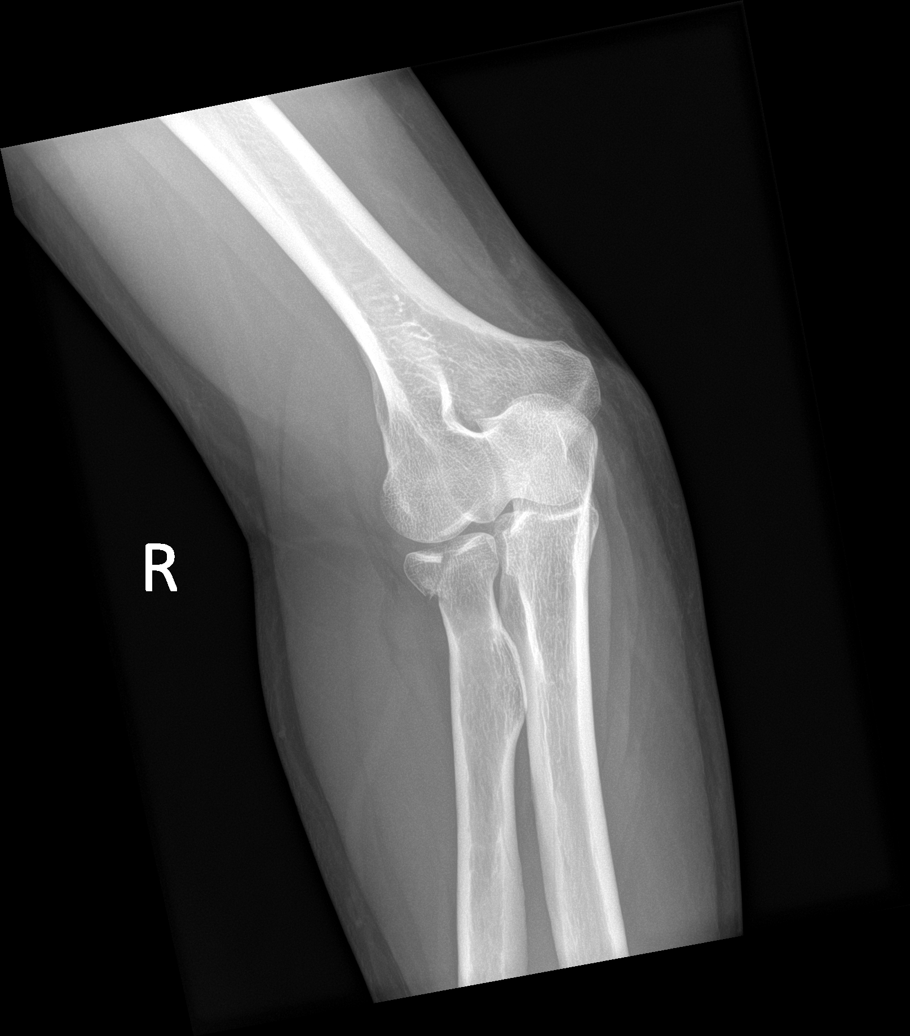

[elbow lat]
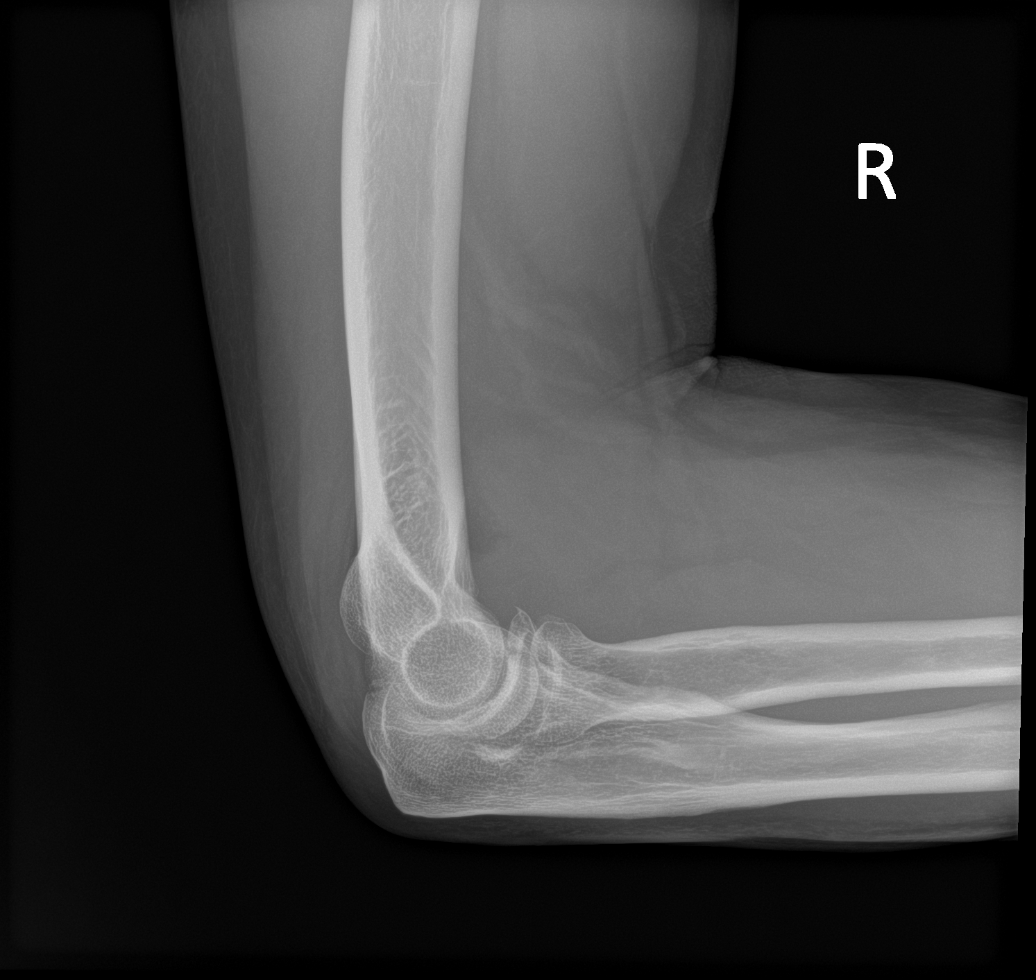

[4 of 4 positions shown; findings below may reference images not displayed]

FINDINGS: Acute mildly displaced fracture of the right radial head and neck.
Nondisplaced fracture through the coronoid process of the ulna.
Additional nondisplaced fracture of the tip of the olecranon process
of the proximal ulna. Moderate-sized elbow joint effusion. No
dislocation. Diffuse soft tissue swelling.
IMPRESSION: 1. Acute mildly displaced fracture of the right radial head and
neck.
2. Nondisplaced fractures through the coronoid process and olecranon
process of the proximal ulna.

## 2023-10-04 ENCOUNTER — Other Ambulatory Visit: Payer: Self-pay | Admitting: Orthopedic Surgery

## 2023-10-04 DIAGNOSIS — M542 Cervicalgia: Secondary | ICD-10-CM

## 2023-10-10 IMAGING — DX DG ELBOW 2V*R*
3 series · 3 of 3 positions shown · non-contrast
Comparison: X-ray 03/31/2021; CT 03/27/2021.

CLINICAL DATA: Right elbow fracture followup.

EXAM:
RIGHT ELBOW - 2 VIEW

[elbow ap (1 of 2)]
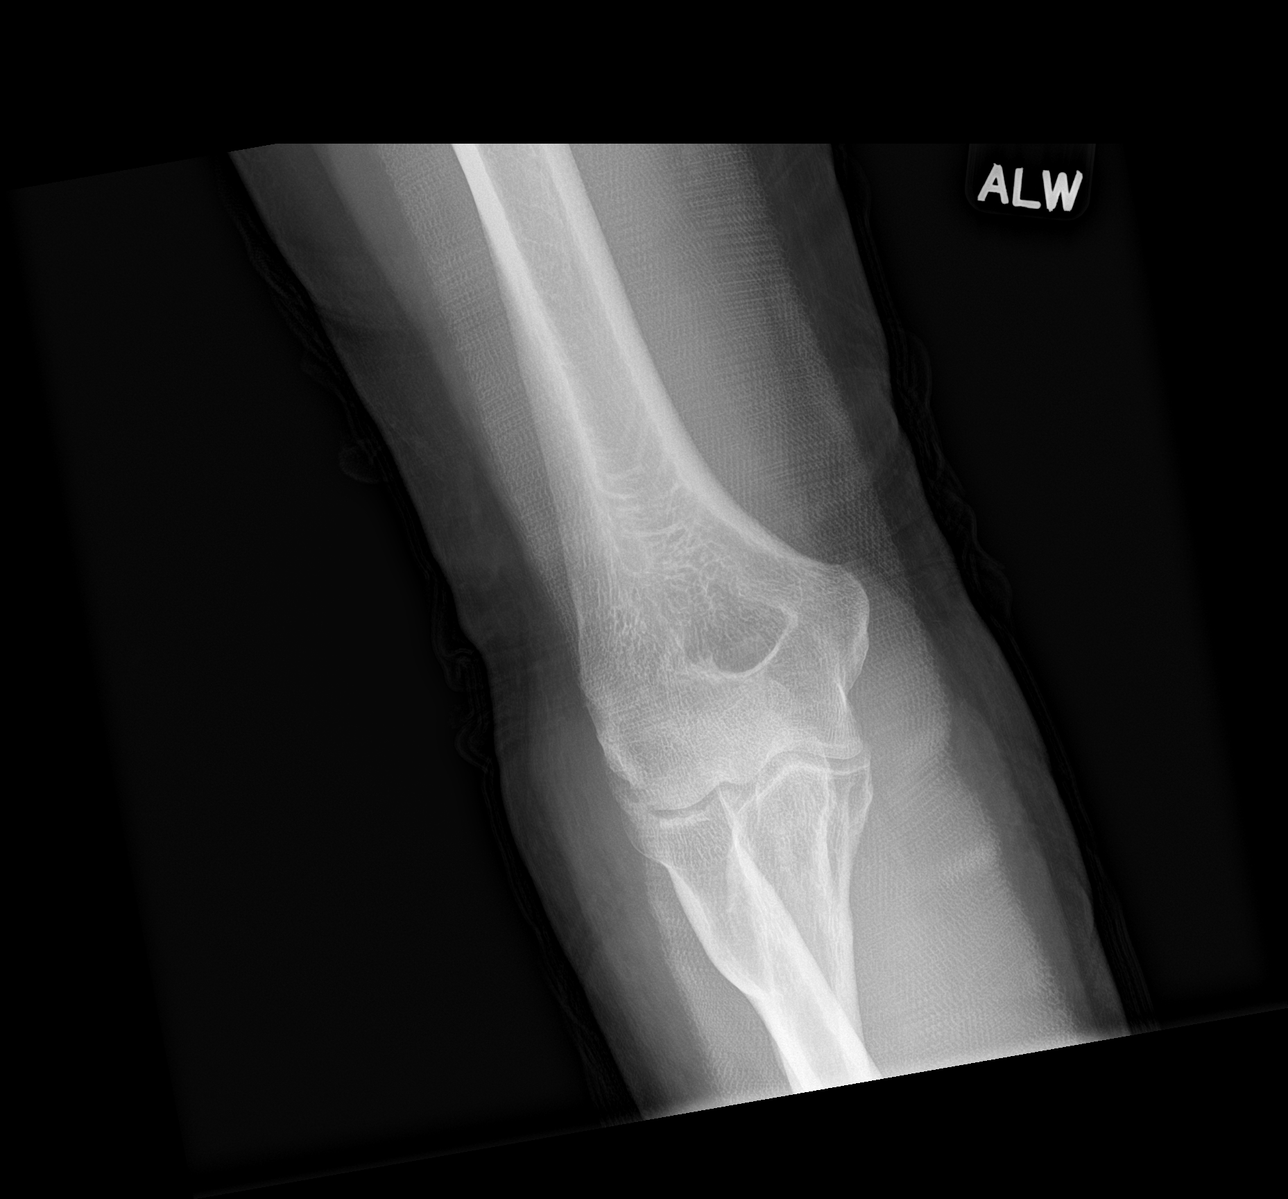

[elbow lat]
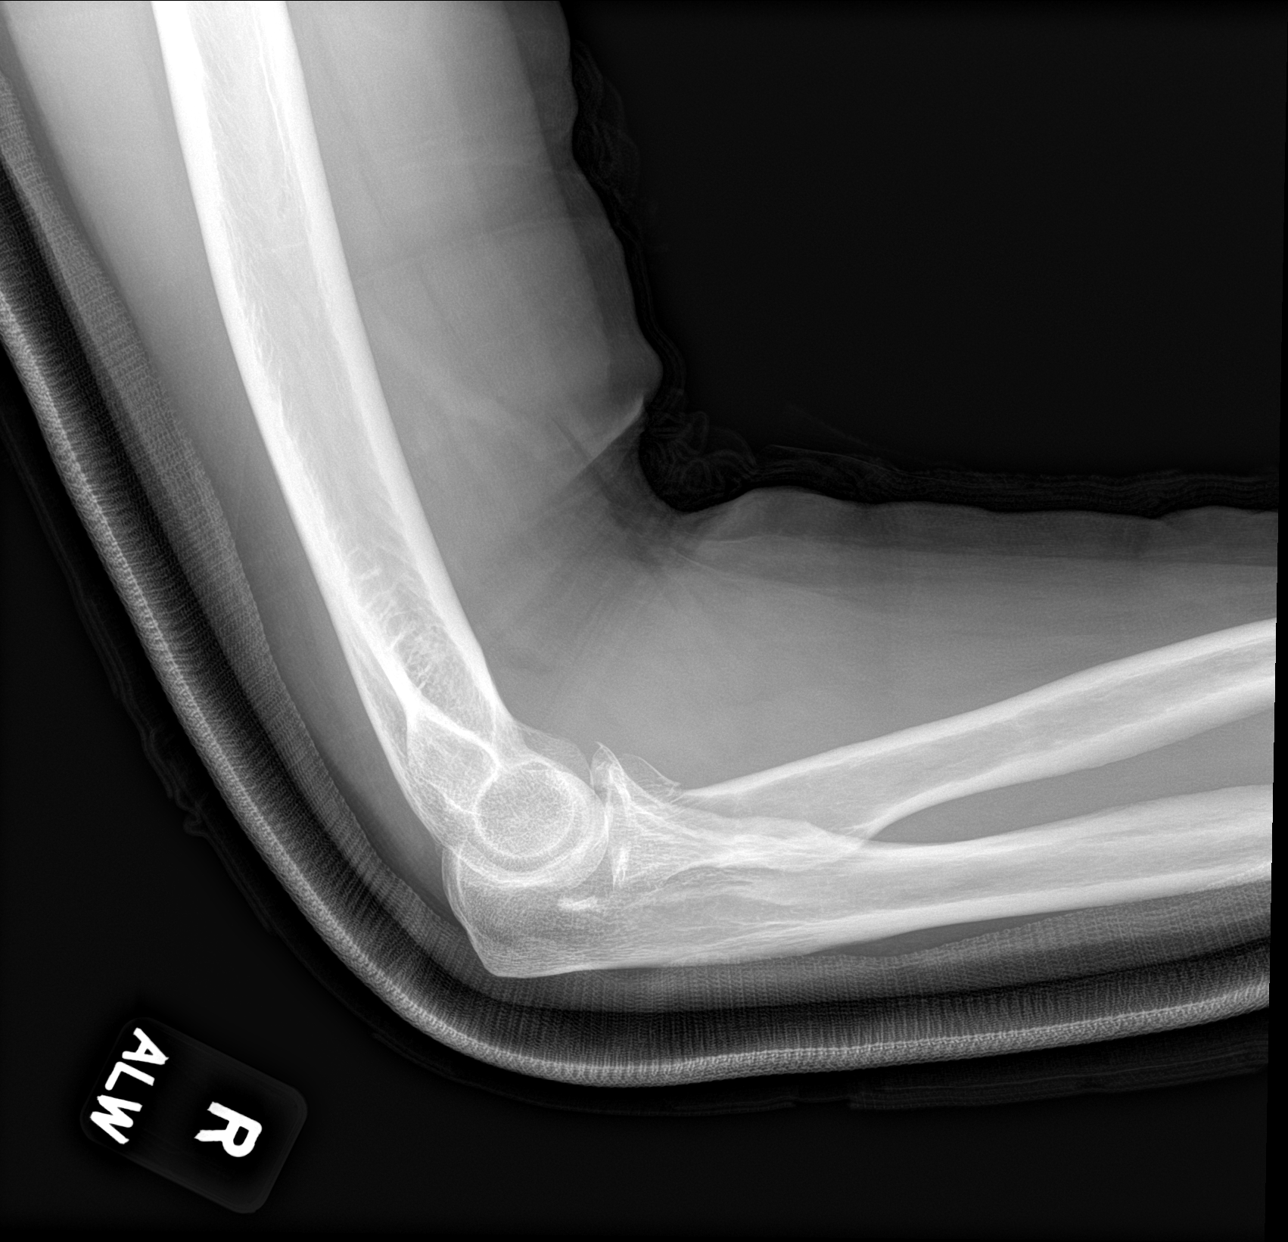

[elbow ap (2 of 2)]
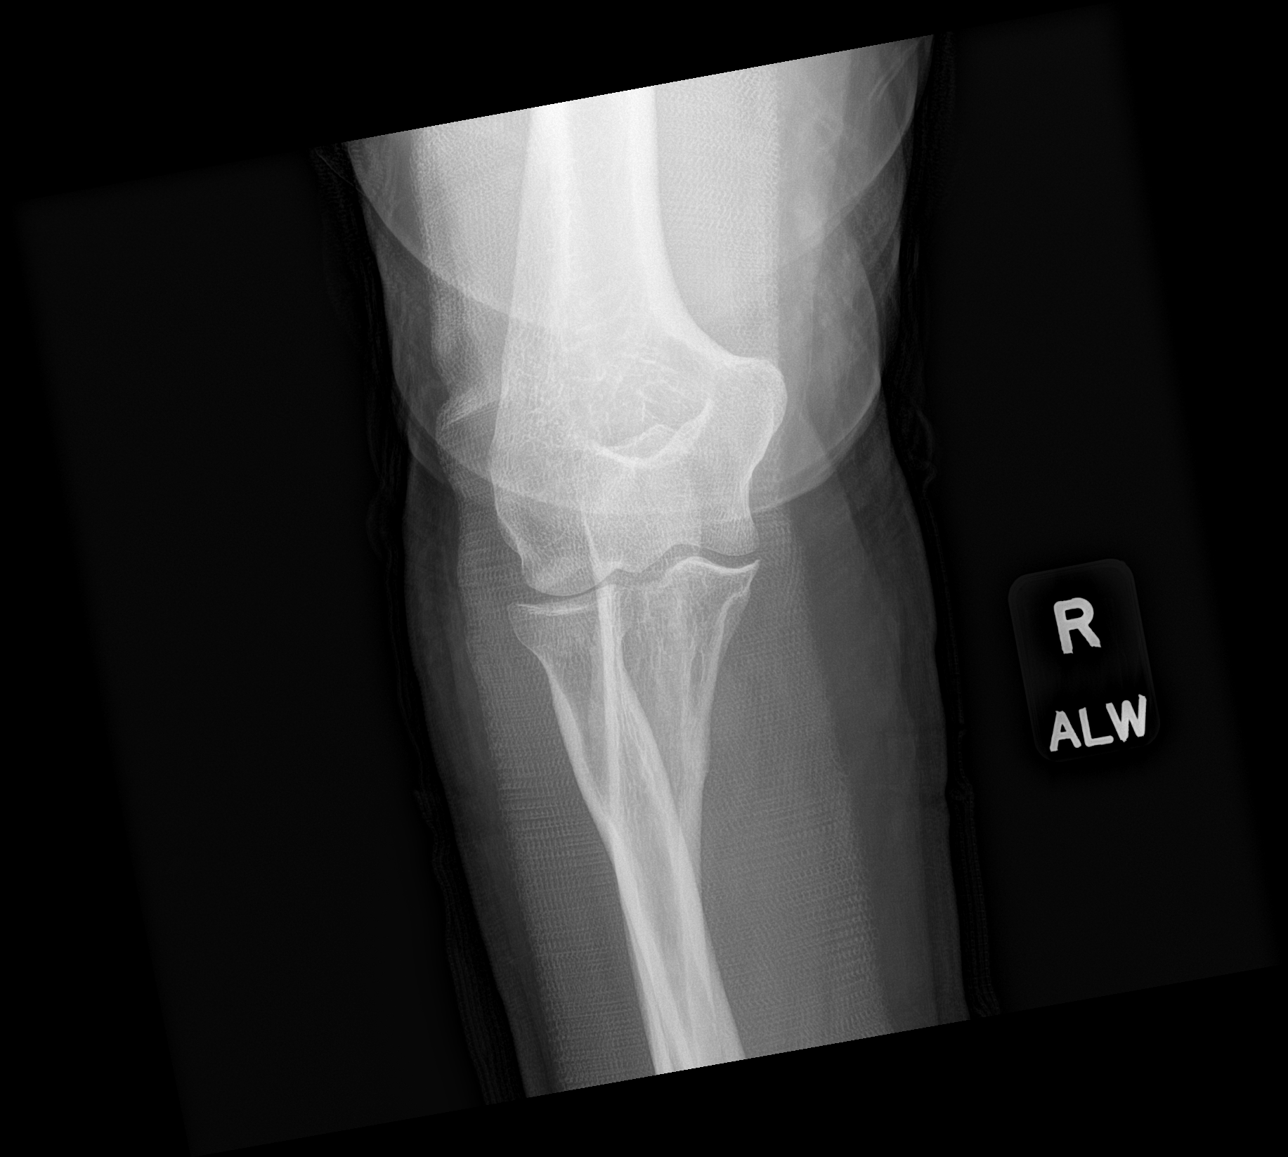

[3 of 3 positions shown; findings below may reference images not displayed]

FINDINGS: Fractures of the coronoid process and anterior radial head are again
demonstrated. The fracture line through the coronoid process is not
as well visualized today. The fragments are unchanged in position.
Stable effusion.
IMPRESSION: 1. Slightly less well-defined fracture involving the coronoid
process, possibly due to mild interval healing.
2. Stable anterior radial head fracture without well visualized
fracture lines currently or previously.
3. Stable effusion.

## 2023-10-17 NOTE — Discharge Instructions (Signed)

## 2023-10-18 ENCOUNTER — Ambulatory Visit
Admission: RE | Admit: 2023-10-18 | Discharge: 2023-10-18 | Disposition: A | Source: Ambulatory Visit | Attending: Orthopedic Surgery | Admitting: Orthopedic Surgery

## 2023-10-18 ENCOUNTER — Inpatient Hospital Stay
Admission: RE | Admit: 2023-10-18 | Discharge: 2023-10-18 | Disposition: A | Source: Ambulatory Visit | Attending: Orthopedic Surgery | Admitting: Orthopedic Surgery

## 2023-10-18 DIAGNOSIS — M542 Cervicalgia: Secondary | ICD-10-CM

## 2023-10-18 MED ORDER — MEPERIDINE HCL 50 MG/ML IJ SOLN: 50.0000 mg | Freq: Once | INTRAMUSCULAR | Status: DC | PRN

## 2023-10-18 MED ORDER — ONDANSETRON HCL 4 MG/2ML IJ SOLN: 4.0000 mg | Freq: Once | INTRAMUSCULAR | Status: DC | PRN

## 2023-10-18 MED ORDER — IOPAMIDOL (ISOVUE-M 300) INJECTION 61%
10.0000 mL | Freq: Once | INTRAMUSCULAR | Status: AC | PRN
  Administered 2023-10-18: 10 mL via INTRATHECAL

## 2023-10-18 MED ORDER — DIAZEPAM 5 MG PO TABS
10.0000 mg | ORAL_TABLET | Freq: Once | ORAL | Status: AC
  Administered 2023-10-18: 10 mg via ORAL

## 2023-10-18 NOTE — Procedures (Signed)
 Consent for Operation or Procedure: Provider Certification I hereby certify that the nature, purpose, benefits, usual and most frequent risks of, and alternatives to, the operation or procedure have been explained to the patient (or person authorized to sign for the patient) either by a physician or by the provider who is to perform the operation or procedure, that the patient has had an opportunity to ask questions, and that those questions have been answered. The patient or the patient's representative has been advised that selected tasks may be performed by assistants to the primary health care provider(s). I believe that the patient (or person authorized to sign for the patient) understands what has been explained, and has consented to the operation or procedure.   PROCEDURE SUMMARY:   Successful fluoro guided LP for cervical myelogram.  EBL: less than 1 mL   Please see full dictation in imaging section in Epic for details.    Bless Lisenby H Phillipe Clemon PA-C 10/18/2023 1:30 PM

## 2024-06-04 ENCOUNTER — Ambulatory Visit: Admitting: Adult Health
